# Patient Record
Sex: Female | Born: 1985 | Race: Black or African American | Hispanic: No | Marital: Single | Smoking: Never smoker
Health system: Southern US, Community
[De-identification: ages and names within clinical notes are randomized; demographics above are authoritative.]

## PROBLEM LIST (undated history)

## (undated) DIAGNOSIS — D696 Thrombocytopenia, unspecified: Secondary | ICD-10-CM

## (undated) DIAGNOSIS — J45909 Unspecified asthma, uncomplicated: Secondary | ICD-10-CM

## (undated) DIAGNOSIS — Z969 Presence of functional implant, unspecified: Secondary | ICD-10-CM

## (undated) DIAGNOSIS — E669 Obesity, unspecified: Secondary | ICD-10-CM

## (undated) DIAGNOSIS — D649 Anemia, unspecified: Secondary | ICD-10-CM

## (undated) HISTORY — PX: ORIF ELBOW FRACTURE: SHX5031

---

## 2008-02-16 ENCOUNTER — Other Ambulatory Visit: Payer: Self-pay | Admitting: Physician Assistant

## 2008-02-16 ENCOUNTER — Ambulatory Visit: Payer: Self-pay | Admitting: Obstetrics and Gynecology

## 2008-02-16 ENCOUNTER — Inpatient Hospital Stay (HOSPITAL_COMMUNITY): Admission: AD | Admit: 2008-02-16 | Discharge: 2008-02-17 | Payer: Self-pay | Admitting: Obstetrics and Gynecology

## 2008-02-17 ENCOUNTER — Encounter (INDEPENDENT_AMBULATORY_CARE_PROVIDER_SITE_OTHER): Payer: Self-pay | Admitting: Obstetrics and Gynecology

## 2010-08-05 ENCOUNTER — Inpatient Hospital Stay (HOSPITAL_COMMUNITY)
Admission: AD | Admit: 2010-08-05 | Discharge: 2010-08-07 | Payer: Self-pay | Source: Home / Self Care | Admitting: Family Medicine

## 2010-09-02 ENCOUNTER — Observation Stay (HOSPITAL_COMMUNITY)
Admission: AD | Admit: 2010-09-02 | Discharge: 2010-09-03 | Payer: Self-pay | Source: Home / Self Care | Attending: Obstetrics & Gynecology | Admitting: Obstetrics & Gynecology

## 2010-09-03 HISTORY — PX: CERVICAL CERCLAGE: SHX1329

## 2010-09-05 ENCOUNTER — Encounter: Payer: Self-pay | Admitting: Obstetrics & Gynecology

## 2010-09-05 ENCOUNTER — Ambulatory Visit: Payer: Self-pay | Admitting: Obstetrics and Gynecology

## 2010-09-05 LAB — CONVERTED CEMR LAB
HCT: 35.2 % — ABNORMAL LOW (ref 36.0–46.0)
Hepatitis B Surface Ag: NEGATIVE
Lymphocytes Relative: 21 % (ref 12–46)
Lymphs Abs: 1.7 10*3/uL (ref 0.7–4.0)
Neutrophils Relative %: 75 % (ref 43–77)
Platelets: 43 10*3/uL — ABNORMAL LOW (ref 150–400)
Protein C Activity: 152 % — ABNORMAL HIGH (ref 75–133)
Protein S Activity: 52 % — ABNORMAL LOW (ref 69–129)
Rh Type: POSITIVE
Rubella: 62.4 intl units/mL — ABNORMAL HIGH
WBC: 7.8 10*3/uL (ref 4.0–10.5)

## 2010-09-07 ENCOUNTER — Ambulatory Visit: Payer: Self-pay | Admitting: Internal Medicine

## 2010-09-13 ENCOUNTER — Ambulatory Visit: Payer: Self-pay | Admitting: Obstetrics and Gynecology

## 2010-09-13 ENCOUNTER — Encounter (INDEPENDENT_AMBULATORY_CARE_PROVIDER_SITE_OTHER): Payer: Self-pay | Admitting: *Deleted

## 2010-09-16 ENCOUNTER — Inpatient Hospital Stay (HOSPITAL_COMMUNITY)
Admission: AD | Admit: 2010-09-16 | Discharge: 2010-09-16 | Payer: Self-pay | Source: Home / Self Care | Attending: Family Medicine | Admitting: Family Medicine

## 2010-09-19 ENCOUNTER — Ambulatory Visit: Payer: Self-pay | Admitting: Obstetrics & Gynecology

## 2010-09-20 ENCOUNTER — Encounter: Payer: Self-pay | Admitting: Obstetrics & Gynecology

## 2010-09-20 LAB — CONVERTED CEMR LAB: Hgb A1c MFr Bld: 5.7 % — ABNORMAL HIGH (ref <5.7)

## 2010-09-26 ENCOUNTER — Inpatient Hospital Stay (HOSPITAL_COMMUNITY)
Admission: AD | Admit: 2010-09-26 | Discharge: 2010-09-26 | Payer: Self-pay | Source: Home / Self Care | Attending: Obstetrics & Gynecology | Admitting: Obstetrics & Gynecology

## 2010-09-26 ENCOUNTER — Ambulatory Visit
Admission: RE | Admit: 2010-09-26 | Discharge: 2010-09-26 | Payer: Self-pay | Source: Home / Self Care | Attending: Obstetrics & Gynecology | Admitting: Obstetrics & Gynecology

## 2010-09-26 ENCOUNTER — Encounter
Admission: RE | Admit: 2010-09-26 | Discharge: 2010-10-18 | Payer: Self-pay | Source: Home / Self Care | Attending: Obstetrics and Gynecology | Admitting: Obstetrics and Gynecology

## 2010-09-30 ENCOUNTER — Ambulatory Visit (HOSPITAL_COMMUNITY)
Admission: RE | Admit: 2010-09-30 | Discharge: 2010-09-30 | Payer: Self-pay | Source: Home / Self Care | Attending: Obstetrics & Gynecology | Admitting: Obstetrics & Gynecology

## 2010-09-30 ENCOUNTER — Inpatient Hospital Stay (HOSPITAL_COMMUNITY)
Admission: AD | Admit: 2010-09-30 | Discharge: 2010-10-04 | Payer: Self-pay | Source: Home / Self Care | Attending: Obstetrics & Gynecology | Admitting: Obstetrics & Gynecology

## 2010-10-03 ENCOUNTER — Ambulatory Visit: Admit: 2010-10-03 | Payer: Self-pay | Admitting: Obstetrics and Gynecology

## 2010-10-03 LAB — GLUCOSE, CAPILLARY
Glucose-Capillary: 147 mg/dL — ABNORMAL HIGH (ref 70–99)
Glucose-Capillary: 228 mg/dL — ABNORMAL HIGH (ref 70–99)
Glucose-Capillary: 112 mg/dL — ABNORMAL HIGH (ref 70–99)
Glucose-Capillary: 139 mg/dL — ABNORMAL HIGH (ref 70–99)
Glucose-Capillary: 146 mg/dL — ABNORMAL HIGH (ref 70–99)
Glucose-Capillary: 59 mg/dL — ABNORMAL LOW (ref 70–99)
Glucose-Capillary: 77 mg/dL (ref 70–99)

## 2010-10-03 LAB — STREP B DNA PROBE: Strep Group B Ag: NEGATIVE

## 2010-10-03 LAB — POCT URINALYSIS DIPSTICK
Bilirubin Urine: NEGATIVE
Hgb urine dipstick: NEGATIVE
Ketones, ur: 40 mg/dL — AB
Nitrite: NEGATIVE
Protein, ur: NEGATIVE mg/dL
Specific Gravity, Urine: >=1.03 (ref 1.005–1.030)
Urine Glucose, Fasting: NEGATIVE mg/dL
Urobilinogen, UA: 1 mg/dL (ref 0.0–1.0)
pH: 6 (ref 5.0–8.0)

## 2010-10-03 LAB — CBC
HCT: 32.2 % — ABNORMAL LOW (ref 36.0–46.0)
Hemoglobin: 11.2 g/dL — ABNORMAL LOW (ref 12.0–15.0)
MCH: 29.2 pg (ref 26.0–34.0)
MCHC: 34.8 g/dL (ref 30.0–36.0)
MCV: 83.9 fL (ref 78.0–100.0)
Platelets: DECREASED 10*3/uL (ref 150–400)
RBC: 3.84 MIL/uL — ABNORMAL LOW (ref 3.87–5.11)
RDW: 14.5 % (ref 11.5–15.5)
WBC: 13.8 10*3/uL — ABNORMAL HIGH (ref 4.0–10.5)

## 2010-10-05 LAB — GLUCOSE, CAPILLARY
Glucose-Capillary: 101 mg/dL — ABNORMAL HIGH (ref 70–99)
Glucose-Capillary: 85 mg/dL (ref 70–99)
Glucose-Capillary: 84 mg/dL (ref 70–99)
Glucose-Capillary: 134 mg/dL — ABNORMAL HIGH (ref 70–99)
Glucose-Capillary: 92 mg/dL (ref 70–99)
Glucose-Capillary: 89 mg/dL (ref 70–99)
Glucose-Capillary: 104 mg/dL — ABNORMAL HIGH (ref 70–99)
Glucose-Capillary: 135 mg/dL — ABNORMAL HIGH (ref 70–99)
Glucose-Capillary: 87 mg/dL (ref 70–99)

## 2010-10-07 ENCOUNTER — Ambulatory Visit: Payer: Self-pay | Admitting: Internal Medicine

## 2010-10-10 ENCOUNTER — Ambulatory Visit
Admission: RE | Admit: 2010-10-10 | Discharge: 2010-10-10 | Payer: Self-pay | Source: Home / Self Care | Attending: Obstetrics & Gynecology | Admitting: Obstetrics & Gynecology

## 2010-10-10 ENCOUNTER — Ambulatory Visit (HOSPITAL_COMMUNITY)
Admission: RE | Admit: 2010-10-10 | Discharge: 2010-10-10 | Payer: Self-pay | Source: Home / Self Care | Attending: Obstetrics & Gynecology | Admitting: Obstetrics & Gynecology

## 2010-10-10 ENCOUNTER — Encounter (INDEPENDENT_AMBULATORY_CARE_PROVIDER_SITE_OTHER): Payer: Self-pay | Admitting: *Deleted

## 2010-10-10 NOTE — Discharge Summary (Addendum)
  NAMEKEYRI, [name]                ACCOUNT NO.:  [id number]  MEDICAL RECORD NO.:  [id number]          PATIENT TYPE:  INP  LOCATION:  9156                          [facility]  PHYSICIAN:  Horton Chin, MD DATE OF BIRTH:  16-Apr-1986  DATE OF ADMISSION:  09/30/2010 DATE OF DISCHARGE:  10/04/2010                              DISCHARGE SUMMARY   ADMISSION DIAGNOSES: 1. Intrauterine pregnancy at 23-6/7th weeks. 2. History of cervical incompetence status post cerclage placement     earlier this pregnancy. 3. Current ultrasound showing no measurable cervical length.  DISCHARGE DIAGNOSES: 1. Intrauterine pregnancy at 24-3/7th. 2. Measurable cervix on ultrasound, but no cervical change or signs of     preterm labor. 3. Thrombocytopenia.  PERTINENT STUDIES:  On admission, the patient had an ultrasound with unmeasurable cervical length which was unchanged during her admission. She had no signs or symptoms of labor.  A group B strep culture that was done on October 01, 2010, was negative.  CBC showed a white blood cell count of 13.8, hemoglobin of 11.2, platelet count was not calculated because it appeared decreased, the patient does have a history of thrombocytopenia.  CONSULTS:  Neonatology.  BRIEF HOSPITAL COURSE:  The patient is a 25 year old gravida 4, para 0-1- 2-0, who was admitted at 23-6/7th weeks after cervical length ultrasound showed no demonstrable cervical length.  The patient had a cerclage that was placed which was a rescue cerclage on September 04, 2010, at 20 weeks. Given this new finding of no demonstrable cervical length, the patient was admitted for observation.  She reported rare contractions but did not demonstrate any cervical change.  Fetal heart rate tracing remained reassuring at all times and the patient was also seen by the Neonatology Team for consultation.  She had no vaginal bleeding, leakage of fluid, or frequent contractions, and endorsed good  fetal movement.  On October 04, 2010, the patient was deemed stable on bedrest and no other interventions were needed.  She was deemed stable for discharge home.  DISCHARGE INSTRUCTIONS:  Included being on strict bedrest and pelvic rest.  Preterm labor and fetal movement precautions were reviewed.  The patient was given the routine antenatal discharge instruction sheet.  DISCHARGE MEDICATIONS:  The patient was told to continue on  Prometrium 200 mg per vagina every night, and glyburide 5 mg twice a day.  Of note, her sugars were monitored during her stay and her glyburide was slightly increased to cover her blood sugars, especially after she received betamethasone dose on admission.  She was given a prescription of glyburide 5 mg twice a day and Ambien 10 mg as needed for insomnia.  FOLLOWUP APPOINTMENTS:  Followup appointment has been scheduled in the [facility] on Monday, October 10, 2010 at 9:15 a.m.  This information was communicated to the patient and her mother.     Horton Chin, MD     UAA/MEDQ  D:  10/04/2010  T:  10/05/2010  Job:  [id number]  Electronically Signed by Jaynie Collins MD on 10/10/2010 09:58:08 AM

## 2010-10-11 LAB — CBC WITH DIFFERENTIAL/PLATELET
BASO%: 0.3 % (ref 0.0–2.0)
Basophils Absolute: 0 10*3/uL (ref 0.0–0.1)
EOS%: 0.5 % (ref 0.0–7.0)
Eosinophils Absolute: 0 10*3/uL (ref 0.0–0.5)
HCT: 34.4 % — ABNORMAL LOW (ref 34.8–46.6)
HGB: 11.6 g/dL (ref 11.6–15.9)
LYMPH%: 21.7 % (ref 14.0–49.7)
MCH: 30.2 pg (ref 25.1–34.0)
MCHC: 33.7 g/dL (ref 31.5–36.0)
MCV: 89.8 fL (ref 79.5–101.0)
MONO#: 0.5 10*3/uL (ref 0.1–0.9)
MONO%: 5.1 % (ref 0.0–14.0)
NEUT#: 7 10*3/uL — ABNORMAL HIGH (ref 1.5–6.5)
NEUT%: 72.4 % (ref 38.4–76.8)
Platelets: 46 10*3/uL — ABNORMAL LOW (ref 145–400)
RBC: 3.83 10*6/uL (ref 3.70–5.45)
RDW: 15.1 % — ABNORMAL HIGH (ref 11.2–14.5)
WBC: 9.7 10*3/uL (ref 3.9–10.3)
lymph#: 2.1 10*3/uL (ref 0.9–3.3)

## 2010-10-11 LAB — POCT URINALYSIS DIPSTICK
Bilirubin Urine: NEGATIVE
Hgb urine dipstick: NEGATIVE
Ketones, ur: NEGATIVE mg/dL
Nitrite: NEGATIVE
Protein, ur: NEGATIVE mg/dL
Specific Gravity, Urine: >=1.03 (ref 1.005–1.030)
Urine Glucose, Fasting: NEGATIVE mg/dL
Urobilinogen, UA: 0.2 mg/dL (ref 0.0–1.0)
pH: 7 (ref 5.0–8.0)

## 2010-10-11 LAB — LACTATE DEHYDROGENASE: LDH: 113 U/L (ref 94–250)

## 2010-10-17 ENCOUNTER — Inpatient Hospital Stay (HOSPITAL_COMMUNITY)
Admission: AD | Admit: 2010-10-17 | Discharge: 2010-10-17 | Payer: Self-pay | Source: Home / Self Care | Attending: Obstetrics & Gynecology | Admitting: Obstetrics & Gynecology

## 2010-10-17 ENCOUNTER — Ambulatory Visit: Admit: 2010-10-17 | Payer: Self-pay | Admitting: Obstetrics and Gynecology

## 2010-10-17 LAB — URINALYSIS, ROUTINE W REFLEX MICROSCOPIC
Bilirubin Urine: NEGATIVE
Ketones, ur: NEGATIVE mg/dL
Protein, ur: NEGATIVE mg/dL
Specific Gravity, Urine: >1.03 — ABNORMAL HIGH (ref 1.005–1.030)
Urobilinogen, UA: 0.2 mg/dL (ref 0.0–1.0)

## 2010-10-17 LAB — RAPID URINE DRUG SCREEN, HOSP PERFORMED
Amphetamines: NOT DETECTED
Barbiturates: NOT DETECTED
Opiates: NOT DETECTED

## 2010-10-17 LAB — URINE MICROSCOPIC-ADD ON

## 2010-10-18 LAB — URINE CULTURE: Culture  Setup Time: 201201301142

## 2010-10-24 ENCOUNTER — Other Ambulatory Visit: Payer: Self-pay | Admitting: Family Medicine

## 2010-10-24 ENCOUNTER — Other Ambulatory Visit: Payer: Self-pay

## 2010-10-24 DIAGNOSIS — Z3689 Encounter for other specified antenatal screening: Secondary | ICD-10-CM

## 2010-10-24 DIAGNOSIS — O3430 Maternal care for cervical incompetence, unspecified trimester: Secondary | ICD-10-CM

## 2010-10-24 DIAGNOSIS — O99810 Abnormal glucose complicating pregnancy: Secondary | ICD-10-CM

## 2010-10-24 LAB — POCT URINALYSIS DIPSTICK
Nitrite: NEGATIVE
Urine Glucose, Fasting: NEGATIVE mg/dL
Urobilinogen, UA: 0.2 mg/dL (ref 0.0–1.0)

## 2010-10-25 ENCOUNTER — Ambulatory Visit (HOSPITAL_COMMUNITY)
Admission: RE | Admit: 2010-10-25 | Discharge: 2010-10-25 | Disposition: A | Discharge status: Home / Self Care | Payer: Medicaid Other | Source: Ambulatory Visit | Attending: Family Medicine | Admitting: Family Medicine

## 2010-10-25 DIAGNOSIS — Z3689 Encounter for other specified antenatal screening: Secondary | ICD-10-CM

## 2010-10-28 ENCOUNTER — Ambulatory Visit (HOSPITAL_COMMUNITY): Payer: Medicaid Other

## 2010-10-31 ENCOUNTER — Encounter: Payer: Self-pay | Admitting: Obstetrics & Gynecology

## 2010-10-31 ENCOUNTER — Other Ambulatory Visit: Payer: Self-pay

## 2010-10-31 DIAGNOSIS — O99810 Abnormal glucose complicating pregnancy: Secondary | ICD-10-CM

## 2010-10-31 DIAGNOSIS — IMO0002 Reserved for concepts with insufficient information to code with codable children: Secondary | ICD-10-CM

## 2010-10-31 DIAGNOSIS — O26879 Cervical shortening, unspecified trimester: Secondary | ICD-10-CM

## 2010-10-31 DIAGNOSIS — O09299 Supervision of pregnancy with other poor reproductive or obstetric history, unspecified trimester: Secondary | ICD-10-CM

## 2010-10-31 DIAGNOSIS — O3430 Maternal care for cervical incompetence, unspecified trimester: Secondary | ICD-10-CM

## 2010-10-31 LAB — CONVERTED CEMR LAB
Hgb A1c MFr Bld: 5.3 % (ref <5.7)
MCV: 85.9 fL (ref 78.0–100.0)
Platelets: 57 10*3/uL — ABNORMAL LOW (ref 150–400)
RDW: 14.1 % (ref 11.5–15.5)
WBC: 8.1 10*3/uL (ref 4.0–10.5)

## 2010-10-31 LAB — POCT URINALYSIS DIPSTICK
Bilirubin Urine: NEGATIVE
Nitrite: NEGATIVE
Urine Glucose, Fasting: NEGATIVE mg/dL
pH: 5.5 (ref 5.0–8.0)

## 2010-10-31 LAB — GLUCOSE, CAPILLARY

## 2010-11-07 DIAGNOSIS — O26879 Cervical shortening, unspecified trimester: Secondary | ICD-10-CM

## 2010-11-07 DIAGNOSIS — IMO0002 Reserved for concepts with insufficient information to code with codable children: Secondary | ICD-10-CM

## 2010-11-07 DIAGNOSIS — O3430 Maternal care for cervical incompetence, unspecified trimester: Secondary | ICD-10-CM

## 2010-11-07 DIAGNOSIS — O99810 Abnormal glucose complicating pregnancy: Secondary | ICD-10-CM

## 2010-11-10 ENCOUNTER — Other Ambulatory Visit: Payer: Self-pay | Admitting: Internal Medicine

## 2010-11-10 ENCOUNTER — Encounter (HOSPITAL_BASED_OUTPATIENT_CLINIC_OR_DEPARTMENT_OTHER): Payer: Medicaid Other | Admitting: Internal Medicine

## 2010-11-10 DIAGNOSIS — O99119 Other diseases of the blood and blood-forming organs and certain disorders involving the immune mechanism complicating pregnancy, unspecified trimester: Secondary | ICD-10-CM

## 2010-11-10 DIAGNOSIS — D696 Thrombocytopenia, unspecified: Secondary | ICD-10-CM

## 2010-11-10 DIAGNOSIS — D6942 Congenital and hereditary thrombocytopenia purpura: Secondary | ICD-10-CM

## 2010-11-10 LAB — CBC WITH DIFFERENTIAL/PLATELET
BASO%: 0.3 % (ref 0.0–2.0)
EOS%: 0.6 % (ref 0.0–7.0)
LYMPH%: 25.9 % (ref 14.0–49.7)
MCH: 30.4 pg (ref 25.1–34.0)
MCHC: 34.7 g/dL (ref 31.5–36.0)
MONO#: 0.5 10*3/uL (ref 0.1–0.9)
MONO%: 6.6 % (ref 0.0–14.0)
Platelets: 49 10*3/uL — ABNORMAL LOW (ref 145–400)
RBC: 3.79 10*6/uL (ref 3.70–5.45)
WBC: 7.1 10*3/uL (ref 3.9–10.3)

## 2010-11-10 LAB — LACTATE DEHYDROGENASE: LDH: 142 U/L (ref 94–250)

## 2010-11-14 ENCOUNTER — Other Ambulatory Visit: Payer: Self-pay | Admitting: Family Medicine

## 2010-11-14 DIAGNOSIS — IMO0002 Reserved for concepts with insufficient information to code with codable children: Secondary | ICD-10-CM

## 2010-11-14 DIAGNOSIS — O3430 Maternal care for cervical incompetence, unspecified trimester: Secondary | ICD-10-CM

## 2010-11-14 DIAGNOSIS — O09299 Supervision of pregnancy with other poor reproductive or obstetric history, unspecified trimester: Secondary | ICD-10-CM

## 2010-11-14 DIAGNOSIS — O99810 Abnormal glucose complicating pregnancy: Secondary | ICD-10-CM

## 2010-11-28 ENCOUNTER — Ambulatory Visit (HOSPITAL_COMMUNITY)
Admission: RE | Admit: 2010-11-28 | Discharge: 2010-11-28 | Disposition: A | Discharge status: Home / Self Care | Payer: Medicaid Other | Source: Ambulatory Visit | Attending: Family Medicine | Admitting: Family Medicine

## 2010-11-28 ENCOUNTER — Other Ambulatory Visit: Payer: Self-pay

## 2010-11-28 DIAGNOSIS — O09299 Supervision of pregnancy with other poor reproductive or obstetric history, unspecified trimester: Secondary | ICD-10-CM | POA: Insufficient documentation

## 2010-11-28 DIAGNOSIS — O3430 Maternal care for cervical incompetence, unspecified trimester: Secondary | ICD-10-CM

## 2010-11-28 DIAGNOSIS — O99810 Abnormal glucose complicating pregnancy: Secondary | ICD-10-CM

## 2010-11-28 LAB — WET PREP, GENITAL
Trich, Wet Prep: NONE SEEN
Yeast Wet Prep HPF POC: NONE SEEN

## 2010-11-28 LAB — CBC
HCT: 29.9 % — ABNORMAL LOW (ref 36.0–46.0)
Hemoglobin: 10.7 g/dL — ABNORMAL LOW (ref 12.0–15.0)
MCH: 27.9 pg (ref 26.0–34.0)
MCHC: 34.9 g/dL (ref 30.0–36.0)
MCHC: 35.8 g/dL (ref 30.0–36.0)
MCV: 78.5 fL (ref 78.0–100.0)
RDW: 15 % (ref 11.5–15.5)
RDW: 15.1 % (ref 11.5–15.5)
WBC: 8 10*3/uL (ref 4.0–10.5)

## 2010-11-28 LAB — POCT URINALYSIS DIPSTICK
Bilirubin Urine: NEGATIVE
Ketones, ur: NEGATIVE mg/dL
Ketones, ur: NEGATIVE mg/dL
Nitrite: NEGATIVE
Protein, ur: NEGATIVE mg/dL
Protein, ur: NEGATIVE mg/dL
Protein, ur: NEGATIVE mg/dL
Specific Gravity, Urine: 1.025 (ref 1.005–1.030)
Urobilinogen, UA: 0.2 mg/dL (ref 0.0–1.0)
Urobilinogen, UA: 1 mg/dL (ref 0.0–1.0)
pH: 6 (ref 5.0–8.0)
pH: 6 (ref 5.0–8.0)
pH: 6.5 (ref 5.0–8.0)

## 2010-11-28 LAB — URINALYSIS, ROUTINE W REFLEX MICROSCOPIC
Bilirubin Urine: NEGATIVE
Ketones, ur: NEGATIVE mg/dL
Nitrite: NEGATIVE
Protein, ur: NEGATIVE mg/dL

## 2010-11-28 LAB — ABO/RH: ABO/RH(D): O POS

## 2010-11-29 LAB — COMPREHENSIVE METABOLIC PANEL
ALT: 11 U/L (ref 0–35)
Albumin: 3.1 g/dL — ABNORMAL LOW (ref 3.5–5.2)
Alkaline Phosphatase: 36 U/L — ABNORMAL LOW (ref 39–117)
BUN: 3 mg/dL — ABNORMAL LOW (ref 6–23)
Potassium: 3.8 mEq/L (ref 3.5–5.1)
Sodium: 134 mEq/L — ABNORMAL LOW (ref 135–145)
Total Protein: 6.1 g/dL (ref 6.0–8.3)

## 2010-11-29 LAB — URINE MICROSCOPIC-ADD ON

## 2010-11-29 LAB — URINALYSIS, ROUTINE W REFLEX MICROSCOPIC
Bilirubin Urine: NEGATIVE
Glucose, UA: NEGATIVE mg/dL
Hgb urine dipstick: NEGATIVE
Ketones, ur: NEGATIVE mg/dL
Protein, ur: 30 mg/dL — AB
pH: 6 (ref 5.0–8.0)

## 2010-11-29 LAB — RAPID URINE DRUG SCREEN, HOSP PERFORMED
Amphetamines: NOT DETECTED
Benzodiazepines: NOT DETECTED
Cocaine: NOT DETECTED

## 2010-11-29 LAB — AMYLASE: Amylase: 76 U/L (ref 0–105)

## 2010-11-29 LAB — WET PREP, GENITAL
Trich, Wet Prep: NONE SEEN
Yeast Wet Prep HPF POC: NONE SEEN
Yeast Wet Prep HPF POC: NONE SEEN

## 2010-11-29 LAB — GC/CHLAMYDIA PROBE AMP, GENITAL: Chlamydia, DNA Probe: NEGATIVE

## 2010-11-29 LAB — URINALYSIS, MICROSCOPIC ONLY
Leukocytes, UA: NEGATIVE
Nitrite: NEGATIVE
Specific Gravity, Urine: 1.03 (ref 1.005–1.030)
pH: 6 (ref 5.0–8.0)

## 2010-11-29 LAB — URINE CULTURE: Colony Count: 25000

## 2010-11-29 LAB — CBC
Platelets: 38 10*3/uL — ABNORMAL LOW (ref 150–400)
RDW: 21.3 % — ABNORMAL HIGH (ref 11.5–15.5)
WBC: 8.4 10*3/uL (ref 4.0–10.5)

## 2010-12-02 ENCOUNTER — Other Ambulatory Visit: Payer: Medicaid Other

## 2010-12-02 DIAGNOSIS — O99810 Abnormal glucose complicating pregnancy: Secondary | ICD-10-CM

## 2010-12-05 ENCOUNTER — Other Ambulatory Visit: Payer: Medicaid Other

## 2010-12-05 DIAGNOSIS — O24919 Unspecified diabetes mellitus in pregnancy, unspecified trimester: Secondary | ICD-10-CM

## 2010-12-05 DIAGNOSIS — D696 Thrombocytopenia, unspecified: Secondary | ICD-10-CM

## 2010-12-06 ENCOUNTER — Inpatient Hospital Stay (HOSPITAL_COMMUNITY)
Admission: AD | Admit: 2010-12-06 | Discharge: 2010-12-06 | Disposition: A | Discharge status: Home / Self Care | Payer: Medicaid Other | Source: Ambulatory Visit | Attending: Obstetrics and Gynecology | Admitting: Obstetrics and Gynecology

## 2010-12-06 DIAGNOSIS — O99891 Other specified diseases and conditions complicating pregnancy: Secondary | ICD-10-CM | POA: Insufficient documentation

## 2010-12-06 DIAGNOSIS — O9989 Other specified diseases and conditions complicating pregnancy, childbirth and the puerperium: Secondary | ICD-10-CM

## 2010-12-06 DIAGNOSIS — H109 Unspecified conjunctivitis: Secondary | ICD-10-CM

## 2010-12-07 ENCOUNTER — Other Ambulatory Visit: Payer: Medicaid Other

## 2010-12-07 DIAGNOSIS — O99810 Abnormal glucose complicating pregnancy: Secondary | ICD-10-CM

## 2010-12-09 ENCOUNTER — Other Ambulatory Visit: Payer: Medicaid Other

## 2010-12-14 ENCOUNTER — Inpatient Hospital Stay (HOSPITAL_COMMUNITY)
Admission: AD | Admit: 2010-12-14 | Discharge: 2010-12-17 | DRG: 775 | Disposition: A | Discharge status: Home Health | Payer: Medicaid Other | Source: Ambulatory Visit | Attending: Obstetrics and Gynecology | Admitting: Obstetrics and Gynecology

## 2010-12-14 ENCOUNTER — Other Ambulatory Visit: Payer: Medicaid Other

## 2010-12-14 ENCOUNTER — Other Ambulatory Visit: Payer: Self-pay | Admitting: Obstetrics and Gynecology

## 2010-12-14 DIAGNOSIS — D689 Coagulation defect, unspecified: Secondary | ICD-10-CM | POA: Diagnosis present

## 2010-12-14 DIAGNOSIS — O99814 Abnormal glucose complicating childbirth: Principal | ICD-10-CM | POA: Diagnosis present

## 2010-12-14 DIAGNOSIS — D696 Thrombocytopenia, unspecified: Secondary | ICD-10-CM | POA: Diagnosis present

## 2010-12-14 DIAGNOSIS — O3430 Maternal care for cervical incompetence, unspecified trimester: Secondary | ICD-10-CM | POA: Diagnosis present

## 2010-12-14 DIAGNOSIS — O99810 Abnormal glucose complicating pregnancy: Secondary | ICD-10-CM

## 2010-12-14 DIAGNOSIS — Z8673 Personal history of transient ischemic attack (TIA), and cerebral infarction without residual deficits: Secondary | ICD-10-CM

## 2010-12-14 DIAGNOSIS — Z331 Pregnant state, incidental: Secondary | ICD-10-CM

## 2010-12-14 DIAGNOSIS — O4290 Premature rupture of membranes, unspecified as to length of time between rupture and onset of labor, unspecified weeks of gestation: Secondary | ICD-10-CM | POA: Diagnosis present

## 2010-12-14 LAB — CBC
HCT: 35.3 % — ABNORMAL LOW (ref 36.0–46.0)
Hemoglobin: 12.1 g/dL (ref 12.0–15.0)
MCH: 28.7 pg (ref 26.0–34.0)
MCV: 83.8 fL (ref 78.0–100.0)
Platelets: 59 10*3/uL — ABNORMAL LOW (ref 150–400)
RBC: 4.21 MIL/uL (ref 3.87–5.11)

## 2010-12-14 LAB — POCT URINALYSIS DIP (DEVICE)
Glucose, UA: NEGATIVE mg/dL
Specific Gravity, Urine: 1.015 (ref 1.005–1.030)
Urobilinogen, UA: 1 mg/dL (ref 0.0–1.0)

## 2010-12-15 ENCOUNTER — Other Ambulatory Visit: Payer: Self-pay | Admitting: Family Medicine

## 2010-12-15 DIAGNOSIS — O9912 Other diseases of the blood and blood-forming organs and certain disorders involving the immune mechanism complicating childbirth: Secondary | ICD-10-CM

## 2010-12-15 DIAGNOSIS — O99814 Abnormal glucose complicating childbirth: Secondary | ICD-10-CM

## 2010-12-15 DIAGNOSIS — D689 Coagulation defect, unspecified: Secondary | ICD-10-CM

## 2010-12-15 DIAGNOSIS — O3430 Maternal care for cervical incompetence, unspecified trimester: Secondary | ICD-10-CM

## 2010-12-15 LAB — GLUCOSE, CAPILLARY
Glucose-Capillary: 84 mg/dL (ref 70–99)
Glucose-Capillary: 88 mg/dL (ref 70–99)
Glucose-Capillary: 96 mg/dL (ref 70–99)

## 2010-12-20 NOTE — Discharge Summary (Addendum)
NAMESUSSIE, MINOR                ACCOUNT NO.:  [id number]  MEDICAL RECORD NO.:  [id number]           PATIENT TYPE:  I  LOCATION:  9309                          [facility]  PHYSICIAN:  Catalina Antigua, MD     DATE OF BIRTH:  May 15, 1986  DATE OF ADMISSION:  12/14/2010 DATE OF DISCHARGE:  12/17/2010                              DISCHARGE SUMMARY   The patient was admitted in labor and was admitted for labor and delivery.  She had a history of a stroke last placement and gestational diabetes which was also managed during her hospital stay.  ADMISSION DIAGNOSES:  A 25 year old gravida 4, para 0-1-1-0 who was at 96 weeks and 4 days' estimated gestational age with a history of a cerclage that was placed early in pregnancy, gestational diabetes, and premature rupture of membranes.  DISCHARGE DIAGNOSES:  A 25 year old gravida 4, para 0-2-2-1, status post SVD, history of gestational diabetes and premature rupture of membranes.  HISTORY OF PRESENT ILLNESS:  The patient is a 25 year old gravida 4, now para 0-2-2-1 who presented at 34 weeks and 4 days in the [facility] who was noted to have prolonged rupture of membranes.  She states that she started leaking fluid approximately on December 06, 2010. She was out of town and presented to the clinic on December 14, 2010, and was examined and was determined to have been ruptured and was admitted for labor.  She has a history of cerclage that was placed and gestational diabetes that she was taking glyburide for.  The patient was admitted and induction of labor was started after the cerclage was removed.  One cerclage was removed with informed consent by Dr. Catalina Antigua.  She was monitored for labor and also noted in general thrombocytopenia during her labor process.  She was given penicillin for GBS prophylaxis and Pitocin for her induction.  No epidural was used due to her thrombocytopenia.  The patient delivered spontaneously  without difficulty on December 15, 2010.  She delivered a viable infant female who remains in NICU at the time of this dictation.  Upon discharge, the patient is noted to be coming her breast milk and is unsure what she wants to do her birth control at this point.  She had previously stated she was interested in doing a bilateral tubal ligation, but her decision was continued to wax and wane.  She is given discharge instructions to present to [facility] within 4 weeks to discuss her birth control options, especially if she is decided she would like a bilateral tubal ligation, so she could be placed on the OR Board.  She declines Depo- Provera at the time of discharge and gives no real commitment to any form of birth control upon discharge.  The patient is instructed to return to the MAU for acute care if she develops abdominal tenderness associated with fever and increased bleeding or any other concerns in the postpartum.  We will address this care for her as necessary.  She voices understanding and agrees with these plans.  The patient is discharged home.    ______________________________ Selena Lesser  Natale Milch, DO   ______________________________ Catalina Antigua, MD    SH/MEDQ  D:  12/17/2010  T:  12/17/2010  Job:  [id number]  Electronically Signed by Lucina Mellow MD on 12/20/2010 09:19:30 AM Electronically Signed by Catalina Antigua  on 12/27/2010 10:39:16 AM

## 2011-01-11 ENCOUNTER — Ambulatory Visit (INDEPENDENT_AMBULATORY_CARE_PROVIDER_SITE_OTHER): Payer: Medicaid Other | Admitting: Obstetrics and Gynecology

## 2011-01-11 NOTE — H&P (Signed)
[name] [name], [name] [name]                ACCOUNT NO.:  [id number]  MEDICAL RECORD NO.:  [id number]           PATIENT TYPE:  A  [facility]ATION:  [facility]                   [facility]  PHYSICIAN:  Catalina Antigua, MD     DATE OF BIRTH:  [birth year]  DATE OF SERVICE:                          PRE-OP HISTORY & PHYSICAL  HISTORY OF PRESENT ILLNESS:  This is a 25 year old G4 P0-2-2-1 who is status post vaginal delivery on December 15, 2010 at 34 weeks and 5 days status post TPROM presenting today for postoperative check.  The patient is doing well.  Denies abnormal bleeding or discharge, is currently breast and bottle feeding.  The baby is doing well and is at home with the mother.  The patient denies any signs or symptoms of postpartum depression.  The patient reports receiving ample help with the baby. The patient is also interested in bilateral tubal ligation.  She had her consent for sterilization paper signed on October 24, 2010.  The patient is without any other complaints.  PAST MEDICAL HISTORY:  She denies.  PAST SURGICAL HISTORY:  Also denies.  PAST OB HISTORY:  She has had history of a 25 week IUFD.  She has had a termination of spontaneous miscarriage and this pregnancy was complicated by incompetent cervix requiring a cervical cerclage and TPROM at 34 weeks.  GYN HISTORY:  She denies history of cyst, fibroids, or abnormal Pap smear.  FAMILY HISTORY:  Noncontributory.  SOCIAL HISTORY:  She denies drinking, smoking, and use of illicit drugs.  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  No drugs or latex allergy.  MEDICATIONS:  She is currently not taking any medication other than prenatal vitamins.  REVIEW OF SYSTEMS:  Otherwise within normal limits.  PHYSICAL EXAMINATION:  VITAL SIGNS:  Her blood pressure is 129/85, pulse of 81, weight of 85.9 kg, height of 64 inches. LUNGS:  Clear to auscultation bilaterally. HEART:  Regular rate and  rhythm. ABDOMEN:  Soft, nontender, nondistended. BREASTS:  Nontender, nondistended.  No engorgement.  No palpable masses or lymphadenopathy. PELVIC:  Normal vaginal mucosa and normal appearing cervix.  No abnormal bleeding or discharge.  She has small anteverted uterus.  No palpable adnexal masses or tenderness.  ASSESSMENT/PLAN:  A 25 year old G73, P0-2-2-1 who is status post vaginal delivery at 34 weeks and 5 days on March 29 secondary to preterm premature rupture of membranes at 57 and 5 weeks, presenting today for postoperative check and surgical scheduling of bilateral tubal ligation. The patient is medically cleared to resume all activities of daily living.  The patient will be scheduled on Jan 18, 2011 at 1:00 p.m. for bilateral tubal ligation performed laparoscopically.  Risks, benefits, and alternatives were explained.  The patient verbalized understanding. All questions were answered.  The patient was consented for irreversible procedure.  The patient was counseled on other forms of birth control, other long term forms of birth control, and is not interested and truly desires a tubal ligation.          ______________________________ Catalina Antigua, MD    PC/MEDQ  D:  01/11/2011  T:  01/11/2011  Job:  [id number]

## 2011-01-13 ENCOUNTER — Encounter (HOSPITAL_COMMUNITY)
Admission: RE | Admit: 2011-01-13 | Discharge: 2011-01-13 | Disposition: A | Discharge status: Home / Self Care | Payer: Medicaid Other | Source: Ambulatory Visit | Attending: Obstetrics and Gynecology | Admitting: Obstetrics and Gynecology

## 2011-01-13 LAB — CBC
Platelets: 68 10*3/uL — ABNORMAL LOW (ref 150–400)
RBC: 4.17 MIL/uL (ref 3.87–5.11)
RDW: 13.5 % (ref 11.5–15.5)
WBC: 6.8 10*3/uL (ref 4.0–10.5)

## 2011-01-13 LAB — SURGICAL PCR SCREEN: Staphylococcus aureus: POSITIVE — AB

## 2011-01-18 ENCOUNTER — Ambulatory Visit (HOSPITAL_COMMUNITY)
Admission: RE | Admit: 2011-01-18 | Discharge: 2011-01-18 | Disposition: A | Discharge status: Home / Self Care | Payer: Medicaid Other | Source: Ambulatory Visit | Attending: Obstetrics and Gynecology | Admitting: Obstetrics and Gynecology

## 2011-01-18 DIAGNOSIS — Z01812 Encounter for preprocedural laboratory examination: Secondary | ICD-10-CM

## 2011-01-18 DIAGNOSIS — Z01818 Encounter for other preprocedural examination: Secondary | ICD-10-CM

## 2011-01-18 DIAGNOSIS — Z302 Encounter for sterilization: Secondary | ICD-10-CM | POA: Insufficient documentation

## 2011-01-18 HISTORY — PX: TUBAL LIGATION: SHX77

## 2011-01-18 LAB — PREGNANCY, URINE: Preg Test, Ur: NEGATIVE

## 2011-01-31 NOTE — Discharge Summary (Signed)
NAMEELHAM, FINI                ACCOUNT NO.:  [id number]   MEDICAL RECORD NO.:  [id number]          PATIENT TYPE:  INP   LOCATION:  9372                          [facility]   PHYSICIAN:  Ginger Carne, MD  DATE OF BIRTH:  [birth year]   DATE OF ADMISSION:  02/16/2008  DATE OF DISCHARGE:  02/17/2008                               DISCHARGE SUMMARY   REASON FOR HOSPITALIZATION:  A 17-week incomplete abortion.   IN-HOSPITAL PROCEDURES:  Spontaneous delivery of second trimester under  20-week fetus and spontaneous passage of placenta.   FINAL DIAGNOSIS:  A 17-week incomplete abortion.   HOSPITAL COURSE:  This patient is a 25 year old African American female  gravida 2, para 0-0-1-0, who presented at 29 weeks' gestation with  ruptured membranes and fetal parts in the vagina.  According to the  patient's history, she noted upon wiping herself on Feb 16, 2008, what  felt to be a fetal part.  This was not preceded by bleeding, cramping,  or spontaneous gush of fluid.  The patient presented with apparent  painless dilation of the cervix by history.  Her platelet count was  abnormal at 46,000 and it has remained steady at 43,000 on repeat  testing.  Apparently, the patient states she has family history of same.  The patient passed both fetus as well as placenta on February 17, 2008.  She  has had minimal flow since then.  She is Rh positive.  Vagina and cervix  intact post delivery, and the patient denies significant abdominal pain.   The patient was presented with options for birth control measures.  She  states that oral contraceptives make her nauseated and did not like the  feeling on the patch as well, and did not want Depo-Provera because she  does not like injections.  We discussed an IUD as a possible  alternative.  The patient will present to the [facility]  Department in 4-6 weeks for followup and contraceptive counseling.  The  patient was also informed that she  may well have an incompetent cervix,  and when she conceives again, (the patient advised to wait 6 months),  that she should obtain early prenatal care and mention to the [facility]  that a discussion was held regarding place on of a strap around the  cervix.  She had had a previous voluntary termination of pregnancy.   The patient advised to contact the office for significantly heavy  bleeding, increasing abdominal pain, temperature elevation above 100.4  degrees Fahrenheit, or foul-smelling discharge.  She was also asked to  take ibuprofen 600 mg (3 over-the-counter tablets) up to 4 times a day  as needed.  All questions answered in satisfaction to the patient.  The  patient verbalized understanding of the same.      Ginger Carne, MD  Electronically Signed     SHB/MEDQ  D:  02/17/2008  T:  02/18/2008  Job:  [id number]

## 2011-01-31 NOTE — H&P (Signed)
[name], [name]                ACCOUNT NO.:  [id number]   MEDICAL RECORD NO.:  [id number]          PATIENT TYPE:  INP   LOCATION:  9372                          [facility]   PHYSICIAN:  Phil D. Okey Dupre, M.D.     DATE OF BIRTH:  1986-07-30   DATE OF ADMISSION:  02/16/2008  DATE OF DISCHARGE:                              HISTORY & PHYSICAL   CHIEF COMPLAINT:  Severe abdominal cramping and bleeding.   HISTORY OF PRESENT ILLNESS:  The patient is a 25 year old African  American female, gravida 2, para 0-0-1-0 with a history of volunteered  interruption of pregnancy who in [facility] was found to  be pregnant, and had an ultrasound which was 17-week size fetus.  On  examination, the foot was presenting into the vaginal vault.  She was  sent over here to [facility] as an inevitable abortion.  On  examination in the emergency room, she was re-examined and tried to put  in the speculum, the small fetal foot was noticed.  The patient had been  given Rocephin IV at [facility] prior to her transfer.  When she arrived  here, her CBC note revealed a platelet count of 42,000.  The patient had  previously told that she had clots with heavy periods, but no history of  bruising; however, she had been told before that she had a low platelet  count at 22,000 and a family history where her mother, father, and  brother all have low platelets, however, she had never been fully worked  up for bleeding dyscrasia or ITP.   ALLERGIES:  None.   MEDICATIONS:  At present time, she had been taking none.   FAMILY HISTORY:  Of significance low platelet counts.   REVIEW OF SYSTEMS:  Negative with the exception of the present illness.   PHYSICAL EXAMINATION:  VITAL SIGNS:  Blood pressure is 120/80, pulse is  68 per minute, respirations were 14, and temperature 98.  GENERAL:  A well-developed, well-nourished [place] American female, in  no acute distress.  HEENT:  Within normal limits.  NECK:  Supple with no masses.  Thyroid is symmetrical.  BACK:  Erect.  LUNGS:  Clear to auscultation and percussion.  HEART:  No murmur.  Normal sinus rhythm.  BREASTS:  Symmetrical with no dominant masses.  ABDOMEN:  Soft, flat, and nontender.  PELVIS:  The uterus could be felt about half way up to the umbilicus.  No other organomegaly.  No CVA tenderness.  As mentioned above, there  was a fetal foot into the vaginal vault, and the nurse practitioner did  the pelvic and said she thought she was about 3 cm dilated.  EXTREMITIES:  Negative.  No edema.  No varices.  NEUROLOGIC:  DTRs within normal limits.   IMPRESSION:  1. Inevitable abortion.  2. Thrombocytopenia.  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   PLAN:  The patient has been given Cytotec per rectum in the MAU prior to  arriving on the floor 400 mcg and Pitocin has been put into the IV  bottle.  Phil D. Okey Dupre, M.D.  Electronically Signed     PDR/MEDQ  D:  02/16/2008  T:  02/17/2008  Job:  [id number]

## 2011-01-31 NOTE — Op Note (Signed)
  NAMECHEE, DIMON                ACCOUNT NO.:  [id number]  MEDICAL RECORD NO.:  [id number]           PATIENT TYPE:  O  LOCATION:  [facility]                          [facility]  PHYSICIAN:  Catalina Antigua, MD     DATE OF BIRTH:  06-30-1986  DATE OF PROCEDURE:  01/18/2011 DATE OF DISCHARGE:                              OPERATIVE REPORT   PREOPERATIVE DIAGNOSIS:  A 25 year old G4, P 0-2-2-1 with undesired fertility.  POSTOPERATIVE DIAGNOSIS:  A 25 year old G64, P 0-2-2-1 with undesired fertility.  PROCEDURE:  Laparoscopic bilateral tubal ligation.  SURGEON:  Catalina Antigua, MD  ASSISTANT:  None.  ANESTHESIA:  General.  ESTIMATED BLOOD LOSS:  Minimal.  COMPLICATIONS:  None.  FINDINGS:  Grossly normal uterus, tubes, and ovaries x2.  DESCRIPTION OF PROCEDURE:  After informed consent was obtained, the patient was taken to the operating room where anesthesia was induced and found to be adequate.  The patient was placed in dorsal lithotomy position and prepped and draped in the usual sterile fashion.  A speculum was placed in the vagina.  Anterior lip of cervix was grasped with a single-tooth tenaculum.  The Hulka uterine manipulator was then introduced into the uterine cavity.  The speculum and tenaculum were removed from the patient's vagina.  Surgeon's gloves were then changed and attention was turned to the abdomen.  A 1-cm umbilical incision was made with a scalpel and carried down to the underlying layer of fascia. The fascia was grasped with Kocher clamps, tented up, and entered sharply with Metzenbaum scissors.  The fascia was then tagged with 0- Vicryl.  The 10-mm Hasson was then introduced into the abdominal cavity. Intra-abdominal placement was confirmed using the laparoscope. Pneumoperitoneum was achieved with insufflation of CO2 gas.  The above- noted findings were visualized.  Using the Kleppinger and after identifying the left fallopian tube to its fimbriated  end, the left fallopian tube was cauterized in 3 consecutive segments with complete blanching of approximately a 1.5-2 cm segment of the tube visualized. Attention was then turned to the right fallopian tube which in similar fashion was identified to its fimbriated end and the Kleppinger was utilized to burn the tube in 3 consecutive segments in order for complete blanching of again a 1.5-2 cm segment of the tube visualized. All instruments were removed from the patient's abdomen.  CO2 was released from the patient's abdomen.  The fascia was reapproximated with 0-Vicryl and the skin was closed with 4-0 Monocryl.  The patient tolerated the procedure well.  Sponge, lap, and needle count were correct x2.  The Hulka uterine manipulator was removed from the patient's vagina.  Hemostasis was visualized at the tenaculum site.  The patient was transferred to the recovery room in stable condition.     Catalina Antigua, MD     PC/MEDQ  D:  01/18/2011  T:  01/19/2011  Job:  [id number]  Electronically Signed by Catalina Antigua  on 01/31/2011 07:00:36 PM

## 2011-02-16 ENCOUNTER — Ambulatory Visit: Payer: Medicaid Other | Admitting: Obstetrics and Gynecology

## 2011-02-16 DIAGNOSIS — Z09 Encounter for follow-up examination after completed treatment for conditions other than malignant neoplasm: Secondary | ICD-10-CM

## 2011-02-17 NOTE — Group Therapy Note (Signed)
[name] [name], [name] [name] NO.:  [id number]  MEDICAL RECORD NO.:  [id number]           PATIENT TYPE:  A  [facility]ATION:  [facility]                   [facility]  PHYSICIAN:  Catalina Antigua, MD     DATE OF BIRTH:  1986/05/30  DATE OF SERVICE:  02/16/2011                                 CLINIC NOTE  This is a 25 year old, G4, P 0-2-2-1 who presents today for postoperative check.  The patient is status post laparoscopic bilateral tubal ligation on Jan 18, 2011.  The patient is currently without any complaints.  Denies abnormal drainage from her incision or abdominal pain.  The patient does report, however, worsening symptoms for carpal tunnel syndrome, she describes on her right wrist.  The patient states that status post pregnancy, status post a delivery of her child, the pain seems to have worsened to the point that it makes it difficult for her to carry her child, her newborn.  The patient will also be referred to a neurologist.  In the meantime, the patient was advised to wear a wrist brace to help control her symptoms.  The patient was also advised that if her symptoms worsen, perhaps a visit to the emergency room may be warranted as well.  PHYSICAL EXAMINATION:  VITAL SIGNS:  Her blood pressure is 113/72, pulse of 61, weight of 84.7 kg, and height is 64 inches. ABDOMEN:  Soft, nontender, nondistended and her umbilical incision is completely healed.  ASSESSMENT/PLAN:  This is a 25 year old who is status post laparoscopic bilateral tubal ligation on Jan 18, 2011, who is here for postop check. The patient is cleared to resume all activities of daily living.  The patient will be provided with a referral for neurology appointment for evaluation of possible worsening carpal tunnel syndrome.  The patient is otherwise due for her annual exam in December.          ______________________________ Catalina Antigua, MD    PC/MEDQ  D:  02/16/2011  T:  02/17/2011  Job:   [id number]

## 2011-06-14 LAB — URINE MICROSCOPIC-ADD ON

## 2011-06-14 LAB — CROSSMATCH

## 2011-06-14 LAB — URINALYSIS, ROUTINE W REFLEX MICROSCOPIC
Glucose, UA: NEGATIVE
Specific Gravity, Urine: 1.017
Urobilinogen, UA: 1

## 2011-06-14 LAB — CBC
HCT: 30.1 — ABNORMAL LOW
Hemoglobin: 10.5 — ABNORMAL LOW
MCHC: 34.9
MCV: 79.4
Platelets: 46 — CL
RDW: 16.9 — ABNORMAL HIGH
RDW: 17 — ABNORMAL HIGH
WBC: 9.9

## 2011-06-14 LAB — DIFFERENTIAL
Basophils Absolute: 0
Eosinophils Relative: 1
Monocytes Absolute: 0.7
Monocytes Relative: 7
Neutrophils Relative %: 76

## 2011-06-15 LAB — CBC
HCT: 29.6 — ABNORMAL LOW
Hemoglobin: 10.3 — ABNORMAL LOW
MCHC: 34.8
MCV: 78.9
Platelets: 43 — CL
RBC: 3.75 — ABNORMAL LOW
RDW: 17.2 — ABNORMAL HIGH
WBC: 9.4

## 2011-06-16 IMAGING — US US OB FOLLOW-UP
2 series · 12 of 28 positions shown · non-contrast
Comparison: none

[Series 1: us ob follow up · 26 acquisitions, 9 frames shown (1 of 2)]
[im 2/26]
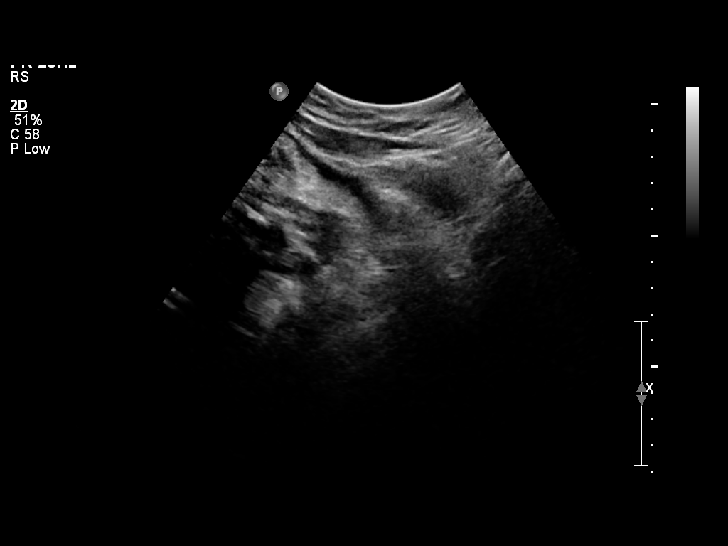
[im 4/26]
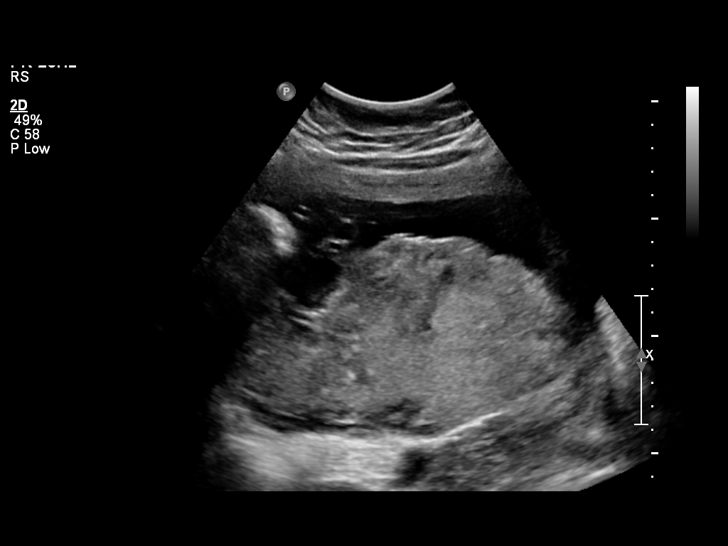
[im 7/26]
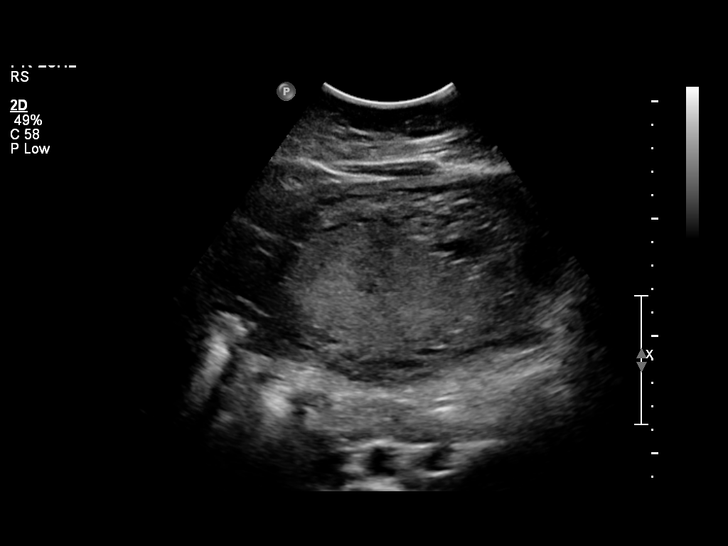
[im 11/26]
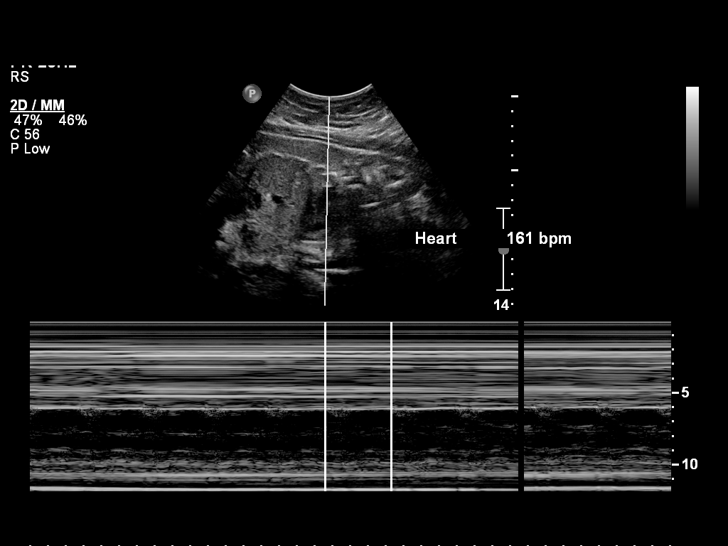
[im 14/26]
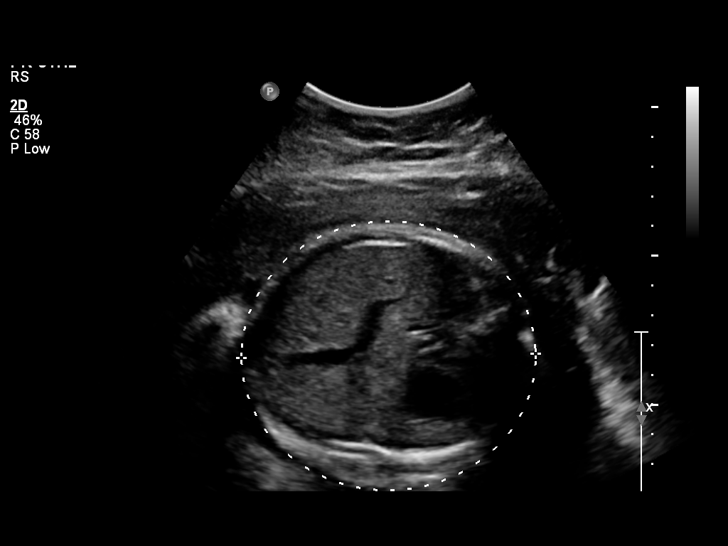
[im 16/26]
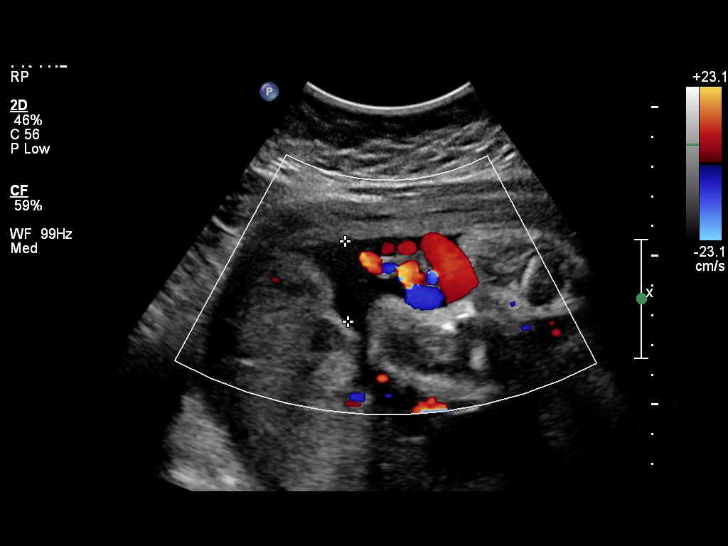
[im 20/26]
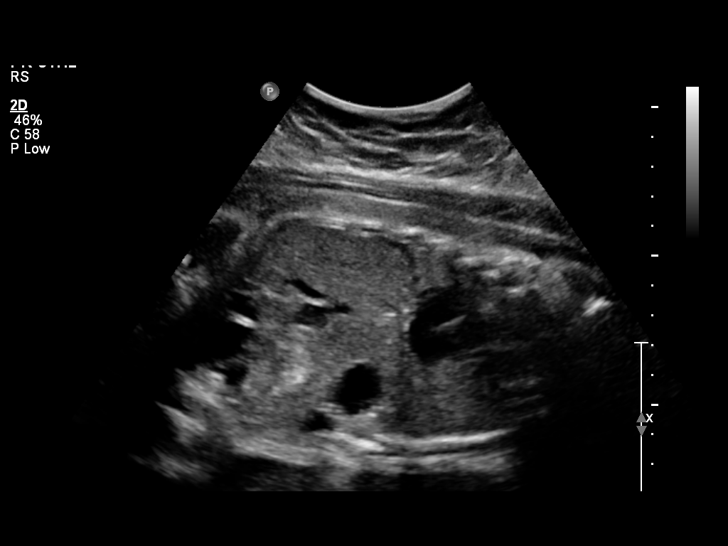
[im 23/26]
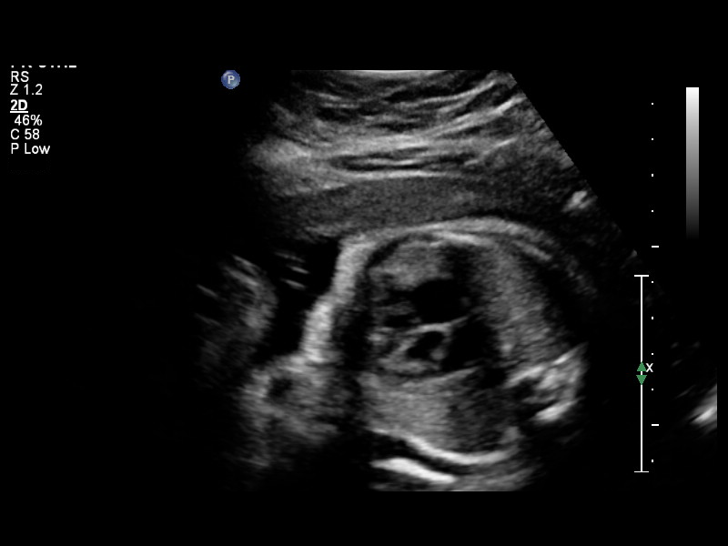
[im 26/26]
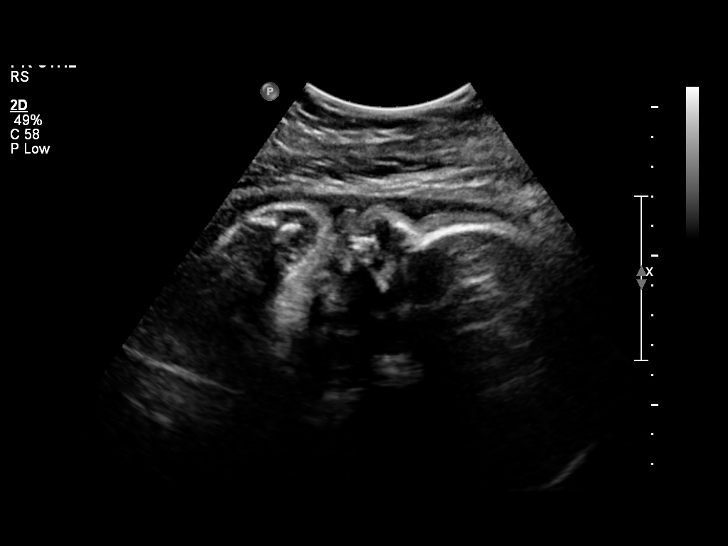

[Series 1: us ob follow up · 3 of 10 slices shown (2 of 2)]
[im 3/10]
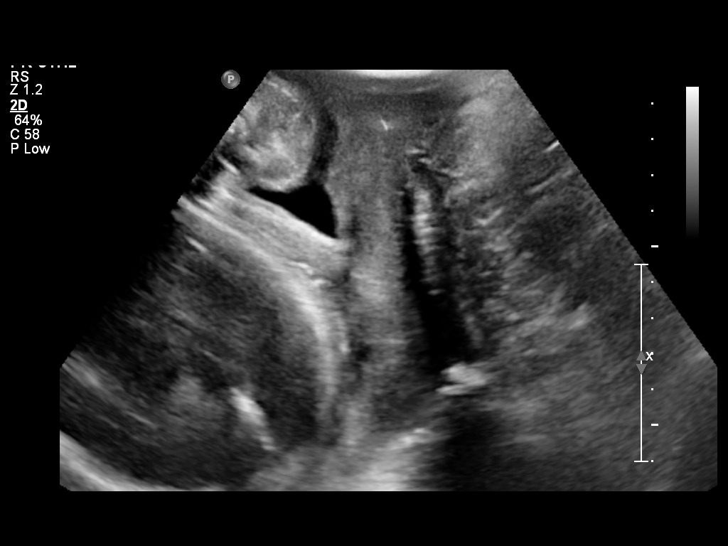
[im 6/10]
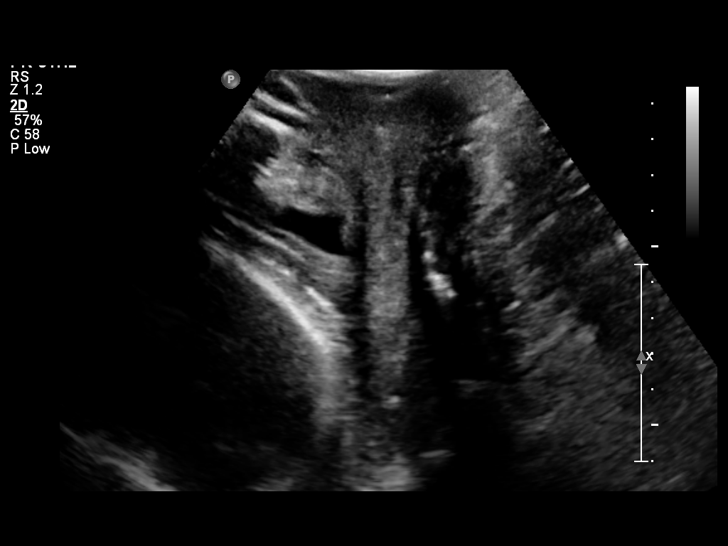
[im 8/10]
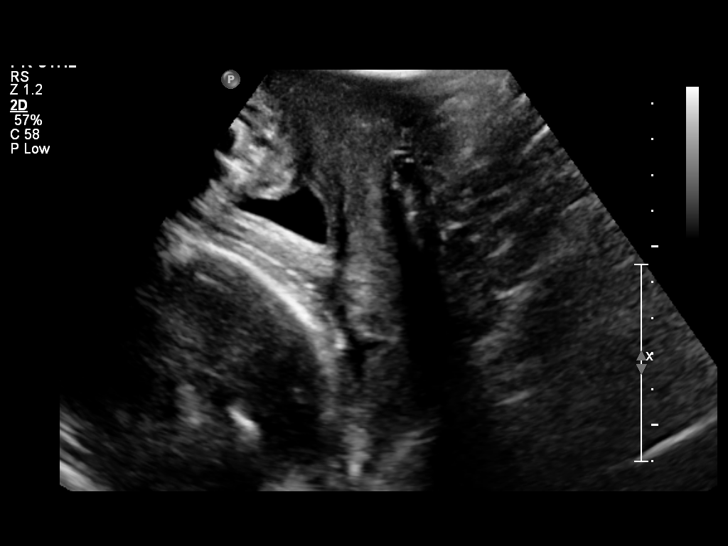

[12 of 28 positions shown; findings below may reference images not displayed]

OBSTETRICS REPORT
                      (Signed Final 11/28/2010 [DATE])

 Order#:         [id number]
Procedures

 US OB FOLLOW UP                                       76816.1
Indications

 Cervical insufficiency
 S/P cerclage
 Diabetes - Gestational
 Poor obstetric history: Previous midtrimester loss
 (16 weeks)
 Poor obstetric history: Previous IUFD (25 weeks)
Fetal Evaluation

 Fetal Heart Rate:  161                          bpm
 Cardiac Activity:  Observed
 Presentation:      Cephalic
 Placenta:          Posterior, above cervical
                    os
 P. Cord            Previously Visualized
 Insertion:

 Amniotic Fluid
 AFI FV:      Subjectively within normal limits
 AFI Sum:     11.66   cm       29  %Tile     Larg Pckt:    3.45  cm
 RUQ:   2.69    cm   RLQ:    2.88   cm    LUQ:   2.64    cm   LLQ:    3.45   cm
Biometry

 BPD:     74.5  mm     G. Age:  29w 6d                CI:        69.52   70 - 86
                                                      FL/HC:      22.9   19.1 -

 HC:     285.2  mm     G. Age:  31w 2d        4  %    HC/AC:      0.96   0.96 -

 AC:     297.5  mm     G. Age:  33w 5d       85  %    FL/BPD:     87.5   71 - 87
 FL:      65.2  mm     G. Age:  33w 4d       74  %    FL/AC:      21.9   20 - 24

 Est. FW:    2111  gm    4 lb 10 oz      72  %
Gestational Age

 U/S Today:     32w 1d                                        EDD:   01/22/11
 Best:          32w 2d     Det. By:  [facility]         EDD:   01/21/11
Anatomy

 Cranium:           Appears normal      Aortic Arch:       Basic anatomy
                                                           exam per order
 Fetal Cavum:       Appears normal      Ductal Arch:       Basic anatomy
                                                           exam per order
 Ventricles:        Previously seen     Diaphragm:         Appears normal
 Choroid Plexus:    Previously seen     Stomach:           Appears
                                                           normal, left
                                                           sided
 Cerebellum:        Previously seen     Abdomen:           Appears normal
 Posterior Fossa:   Previously seen     Abdominal Wall:    Previously seen
 Nuchal Fold:       Previously seen     Cord Vessels:      Not well
                                                           visualized
 Face:              Lips and orbits     Kidneys:           Appear normal
                    previously seen
 Heart:             Previously seen     Bladder:           Not well
                                                           visualized
 RVOT:              Basic anatomy       Spine:             Previously seen
                    exam per order
 LVOT:              Basic anatomy       Limbs:             Previously seen
                    exam per order

 Other:     Technically difficult due to advanced GA and fetal
            position.
Cervix Uterus Adnexa

 Cervical Length:    0.9      cm

 Cervix:       Measured translabially.

 Adnexa:     No abnormality visualized.
Impression

 Single living intrauterine pregnancy in cephalic presentation.
 The estimated gestational age is 32w 2d based on Early
 Ultrasound.
 Estimated fetal weight is 2111g,  72th percentile for
 gestational age of   32w 2d.
 Amniotic fluid index is 11.66 cm.
 There is funneling of the cervix, measured to be 0.9 cm in
 length.

 questions or concerns.

## 2014-01-07 ENCOUNTER — Inpatient Hospital Stay (HOSPITAL_COMMUNITY)
Admission: AD | Admit: 2014-01-07 | Discharge: 2014-01-07 | Disposition: A | Discharge status: Home / Self Care | Payer: 59 | Source: Ambulatory Visit | Attending: Obstetrics & Gynecology | Admitting: Obstetrics & Gynecology

## 2014-01-07 ENCOUNTER — Encounter (HOSPITAL_COMMUNITY): Payer: Self-pay | Admitting: *Deleted

## 2014-01-07 DIAGNOSIS — N920 Excessive and frequent menstruation with regular cycle: Secondary | ICD-10-CM | POA: Insufficient documentation

## 2014-01-07 DIAGNOSIS — N925 Other specified irregular menstruation: Secondary | ICD-10-CM

## 2014-01-07 DIAGNOSIS — R109 Unspecified abdominal pain: Secondary | ICD-10-CM | POA: Insufficient documentation

## 2014-01-07 DIAGNOSIS — D649 Anemia, unspecified: Secondary | ICD-10-CM | POA: Diagnosis not present

## 2014-01-07 DIAGNOSIS — N938 Other specified abnormal uterine and vaginal bleeding: Secondary | ICD-10-CM

## 2014-01-07 DIAGNOSIS — N949 Unspecified condition associated with female genital organs and menstrual cycle: Secondary | ICD-10-CM

## 2014-01-07 HISTORY — DX: Anemia, unspecified: D64.9

## 2014-01-07 LAB — CBC
HEMATOCRIT: 33.7 % — AB (ref 36.0–46.0)
Hemoglobin: 10.8 g/dL — ABNORMAL LOW (ref 12.0–15.0)
MCH: 23.9 pg — AB (ref 26.0–34.0)
MCHC: 32 g/dL (ref 30.0–36.0)
MCV: 74.7 fL — AB (ref 78.0–100.0)
Platelets: 91 10*3/uL — ABNORMAL LOW (ref 150–400)
RBC: 4.51 MIL/uL (ref 3.87–5.11)
RDW: 16.4 % — ABNORMAL HIGH (ref 11.5–15.5)
WBC: 8 10*3/uL (ref 4.0–10.5)

## 2014-01-07 LAB — WET PREP, GENITAL
CLUE CELLS WET PREP: NONE SEEN
Trich, Wet Prep: NONE SEEN
WBC WET PREP: NONE SEEN
Yeast Wet Prep HPF POC: NONE SEEN

## 2014-01-07 LAB — POCT PREGNANCY, URINE: PREG TEST UR: NEGATIVE

## 2014-01-07 MED ORDER — KETOROLAC TROMETHAMINE 60 MG/2ML IM SOLN
60.0000 mg | Freq: Once | INTRAMUSCULAR | Status: AC
  Administered 2014-01-07: 60 mg via INTRAMUSCULAR
  Filled 2014-01-07: qty 2

## 2014-01-07 MED ORDER — NORGESTIMATE-ETH ESTRADIOL 0.25-35 MG-MCG PO TABS: 1.0000 | ORAL_TABLET | Freq: Every day | ORAL | Status: DC

## 2014-01-07 MED ORDER — IBUPROFEN 800 MG PO TABS: 800.0000 mg | ORAL_TABLET | Freq: Three times a day (TID) | ORAL | Status: DC | PRN

## 2014-01-07 NOTE — MAU Provider Note (Signed)
History     CSN: [id number]  Arrival date and time: 01/07/14 1442   First Provider Initiated Contact with Patient 01/07/14 1541      Chief Complaint  Patient presents with  . Vaginal Bleeding   HPI Comments: [name] [name] 28 y.o. G3P1 presents to MAU with several heavy menstrual cycles. She had a BTL 3 years ago and has done well until now. She is in her 3rd day of heavy cycle today and she describes that as changing a pad every 1 hour.  She is also having pain and calls it "9" on 1-10 scale. Pt denies any reasons for not taking BCps including migraine with aura, blood clots, hypertension.  Vaginal Bleeding Associated symptoms include abdominal pain.      Past Medical History  Diagnosis Date  . Anemia     Past Surgical History  Procedure Laterality Date  . Cerclage laparoscopic abdominal    . Cervical cerclage    . Arm surgery    . Tubal ligation      Family History  Problem Relation Age of Onset  . Diabetes Mother   . Hypertension Father     History  Substance Use Topics  . Smoking status: Not on file  . Smokeless tobacco: Not on file  . Alcohol Use: Not on file    Allergies: No Known Allergies  Prescriptions prior to admission  Medication Sig Dispense Refill  . b complex-vitamin c-folic acid (NEPHRO-VITE) 0.8 MG TABS tablet Take 1 tablet by mouth daily.        Review of Systems  Constitutional: Negative.   HENT: Negative.   Respiratory: Negative.   Cardiovascular: Negative.   Gastrointestinal: Positive for abdominal pain.  Genitourinary: Negative.        Heavy menses  Musculoskeletal: Negative.   Skin: Negative.   Neurological: Negative.   Psychiatric/Behavioral: Negative.    Physical Exam   Blood pressure 134/74, pulse 85, temperature 98.2 F (36.8 C), temperature source Oral, resp. rate 20, weight 111.948 kg (246 lb 12.8 oz), last menstrual period 01/05/2014, SpO2 99.00%.  Physical Exam  Constitutional: She is oriented to person, place,  and time. She appears well-developed and well-nourished. No distress.  HENT:  Head: Normocephalic and atraumatic.  Eyes: Pupils are equal, round, and reactive to light.  GI: Soft. There is tenderness.  Genitourinary:  Genital:External negative Vaginal:moderate amount blood Cervix:closed/ no CMT Bimanual:Tender/ difficult to access due to obesity   Musculoskeletal: Normal range of motion.  Neurological: She is alert and oriented to person, place, and time.  Skin: Skin is warm and dry.  Psychiatric: She has a normal mood and affect. Her behavior is normal. Judgment and thought content normal.   Results for orders placed during the hospital encounter of 01/07/14 (from the past 24 hour(s))  CBC     Status: Abnormal   Collection Time    01/07/14  3:41 PM      Result Value Ref Range   WBC 8.0  4.0 - 10.5 K/uL   RBC 4.51  3.87 - 5.11 MIL/uL   Hemoglobin 10.8 (*) 12.0 - 15.0 g/dL   HCT 40.933.7 (*) 81.136.0 - 91.446.0 %   MCV 74.7 (*) 78.0 - 100.0 fL   MCH 23.9 (*) 26.0 - 34.0 pg   MCHC 32.0  30.0 - 36.0 g/dL   RDW 78.216.4 (*) 95.611.5 - 21.315.5 %   Platelets 91 (*) 150 - 400 K/uL  WET PREP, GENITAL     Status: None  Collection Time    01/07/14  4:09 PM      Result Value Ref Range   Yeast Wet Prep HPF POC NONE SEEN  NONE SEEN   Trich, Wet Prep NONE SEEN  NONE SEEN   Clue Cells Wet Prep HPF POC NONE SEEN  NONE SEEN   WBC, Wet Prep HPF POC NONE SEEN  NONE SEEN  POCT PREGNANCY, URINE     Status: None   Collection Time    01/07/14  4:21 PM      Result Value Ref Range   Preg Test, Ur NEGATIVE  NEGATIVE     MAU Course  Procedures  MDM  CBC, Wet prep, GC, Chlamydia Toradol 60 mg IM now/ pain improved to "6"  Assessment and Plan   A: DUB  P: Orthocyclen BCP daily/ 4 refills Establish care with GYN asap Continue with MV with iron Note for work Out patient U/S for bleeding Motrin 800 mg tid prn  Doralee AlbinoLinda M Britanee Vanblarcom 01/07/2014, 4:09 PM

## 2014-01-07 NOTE — Discharge Instructions (Signed)
Abnormal Uterine Bleeding Abnormal uterine bleeding can affect women at various stages in life, including teenagers, women in their reproductive years, pregnant women, and women who have reached menopause. Several kinds of uterine bleeding are considered abnormal, including:  Bleeding or spotting between periods.   Bleeding after sexual intercourse.   Bleeding that is heavier or more than normal.   Periods that last longer than usual.  Bleeding after menopause.  Many cases of abnormal uterine bleeding are minor and simple to treat, while others are more serious. Any type of abnormal bleeding should be evaluated by your health care provider. Treatment will depend on the cause of the bleeding. HOME CARE INSTRUCTIONS Monitor your condition for any changes. The following actions may help to alleviate any discomfort you are experiencing:  Avoid the use of tampons and douches as directed by your health care provider.  Change your pads frequently. You should get regular pelvic exams and Pap tests. Keep all follow-up appointments for diagnostic tests as directed by your health care provider.  SEEK MEDICAL CARE IF:   Your bleeding lasts more than 1 week.   You feel dizzy at times.  SEEK IMMEDIATE MEDICAL CARE IF:   You pass out.   You are changing pads every 15 to 30 minutes.   You have abdominal pain.  You have a fever.   You become sweaty or weak.   You are passing large blood clots from the vagina.   You start to feel nauseous and vomit. MAKE SURE YOU:   Understand these instructions.  Will watch your condition.  Will get help right away if you are not doing well or get worse. Document Released: 09/04/2005 Document Revised: 05/07/2013 Document Reviewed: 04/03/2013 [facility] Patient Information 2014 [employer], [place].  You will be called by Ultrasound to schedule your time

## 2014-01-07 NOTE — MAU Note (Signed)
Bleeding start on Monday, as expected. Feels like losing a lot of blood last couple periods.  Feels weak.

## 2014-01-07 NOTE — MAU Note (Signed)
To restroom  First.  States bleeding is heavy, can't be pregnant, has had her tubes tied.

## 2014-01-07 NOTE — MAU Note (Signed)
Pt states she has had very heavy periods for" the last couple of periods"

## 2014-01-08 LAB — GC/CHLAMYDIA PROBE AMP
CT Probe RNA: NEGATIVE
GC PROBE AMP APTIMA: NEGATIVE

## 2014-07-20 ENCOUNTER — Encounter (HOSPITAL_COMMUNITY): Payer: Self-pay | Admitting: *Deleted

## 2014-09-01 ENCOUNTER — Telehealth: Payer: Self-pay | Admitting: Internal Medicine

## 2014-09-01 NOTE — Telephone Encounter (Signed)
S/W PATIENT AND GAVE NP APPT FOR 12/28 @ 1:45 W/DR. MOHAMED REFERRING [name] LONG, PA DX-THROMBOCYTOPENIA

## 2014-09-01 NOTE — Telephone Encounter (Signed)
CALLED PATIENT TO SCHEDULE NP APPT, PER PATIENT NOT ABLE TO TAKE TIME OFF WILL CALL BACK TO SCHEDULE APPT. LM FOR TIFFAY @ [phone] THAT PATIENT WAS NOT ABLE TO SCHEDULE NP APPT DUE TO WORK.

## 2014-09-10 ENCOUNTER — Other Ambulatory Visit: Payer: Self-pay | Admitting: Medical Oncology

## 2014-09-10 DIAGNOSIS — D696 Thrombocytopenia, unspecified: Secondary | ICD-10-CM

## 2014-09-14 ENCOUNTER — Telehealth: Payer: Self-pay | Admitting: Internal Medicine

## 2014-09-14 ENCOUNTER — Other Ambulatory Visit (HOSPITAL_BASED_OUTPATIENT_CLINIC_OR_DEPARTMENT_OTHER): Payer: 59

## 2014-09-14 ENCOUNTER — Other Ambulatory Visit: Payer: Self-pay | Admitting: Internal Medicine

## 2014-09-14 ENCOUNTER — Ambulatory Visit: Payer: 59

## 2014-09-14 ENCOUNTER — Encounter: Payer: Self-pay | Admitting: Internal Medicine

## 2014-09-14 ENCOUNTER — Ambulatory Visit (HOSPITAL_BASED_OUTPATIENT_CLINIC_OR_DEPARTMENT_OTHER): Payer: 59 | Admitting: Internal Medicine

## 2014-09-14 VITALS — BP 129/74 | HR 60 | Temp 98.6°F | Resp 19 | Ht 64.0 in | Wt 249.1 lb

## 2014-09-14 DIAGNOSIS — D509 Iron deficiency anemia, unspecified: Secondary | ICD-10-CM

## 2014-09-14 DIAGNOSIS — D6942 Congenital and hereditary thrombocytopenia purpura: Secondary | ICD-10-CM | POA: Diagnosis not present

## 2014-09-14 DIAGNOSIS — D696 Thrombocytopenia, unspecified: Secondary | ICD-10-CM

## 2014-09-14 LAB — COMPREHENSIVE METABOLIC PANEL (CC13)
ALBUMIN: 3.7 g/dL (ref 3.5–5.0)
ALT: 20 U/L (ref 0–55)
ANION GAP: 9 meq/L (ref 3–11)
AST: 17 U/L (ref 5–34)
Alkaline Phosphatase: 54 U/L (ref 40–150)
BUN: 9 mg/dL (ref 7.0–26.0)
CALCIUM: 8.6 mg/dL (ref 8.4–10.4)
CHLORIDE: 107 meq/L (ref 98–109)
CO2: 23 mEq/L (ref 22–29)
CREATININE: 0.8 mg/dL (ref 0.6–1.1)
GLUCOSE: 122 mg/dL (ref 70–140)
POTASSIUM: 3.5 meq/L (ref 3.5–5.1)
Sodium: 140 mEq/L (ref 136–145)
Total Bilirubin: 0.25 mg/dL (ref 0.20–1.20)
Total Protein: 6.9 g/dL (ref 6.4–8.3)

## 2014-09-14 LAB — CBC WITH DIFFERENTIAL/PLATELET
BASO%: 0.1 % (ref 0.0–2.0)
BASOS ABS: 0 10*3/uL (ref 0.0–0.1)
EOS ABS: 0.1 10*3/uL (ref 0.0–0.5)
EOS%: 2 % (ref 0.0–7.0)
HCT: 33.6 % — ABNORMAL LOW (ref 34.8–46.6)
HEMOGLOBIN: 10.9 g/dL — AB (ref 11.6–15.9)
LYMPH%: 36.4 % (ref 14.0–49.7)
MCH: 24.5 pg — AB (ref 25.1–34.0)
MCHC: 32.4 g/dL (ref 31.5–36.0)
MCV: 75.7 fL — ABNORMAL LOW (ref 79.5–101.0)
MONO#: 0.4 10*3/uL (ref 0.1–0.9)
MONO%: 5.6 % (ref 0.0–14.0)
NEUT%: 55.9 % (ref 38.4–76.8)
NEUTROS ABS: 3.9 10*3/uL (ref 1.5–6.5)
NRBC: 0 % (ref 0–0)
Platelets: 73 10*3/uL — ABNORMAL LOW (ref 145–400)
RBC: 4.44 10*6/uL (ref 3.70–5.45)
RDW: 15.8 % — ABNORMAL HIGH (ref 11.2–14.5)
WBC: 6.9 10*3/uL (ref 3.9–10.3)
lymph#: 2.5 10*3/uL (ref 0.9–3.3)

## 2014-09-14 NOTE — Telephone Encounter (Signed)
, °

## 2014-09-14 NOTE — Progress Notes (Signed)
[facility] [phone]   Fax:(336) [phone]  CONSULT NOTE  REFERRING PHYSICIAN: SCOTT LONG, PA  REASON FOR CONSULTATION:  28 years old African-American female for reevaluation for persistent thrombocytopenia  HPI [name] [name] is a 28 y.o. 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 The patient was referred back to me today for reevaluation of her persistent thrombocytopenia. She was seen previously in 2011 for the same problem when she was pregnant. She had extensive workup performed at that time [facility] by Dr. Kerry DoryAlice Ma. 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 The patient was given guidance for management of her platelets count during the delivery and she underwent uneventible delivery and her baby is now about 43945 years old. She continues to have mild bruises and occasional nosebleed as well as occasional gum bleed with brushing. She denied having any large areas ecchymosis. She denied having any rectal bleeding. She also has a maternal family history of thrombocytopenia including 2 brothers and her mother. One of her brother is currently undergoing treatment for ITP in [place]. The patient is feeling fine today except for weakness secondary to anemia from heavy menstruation. She denied having any significant nausea or vomiting, no abdominal pain, no fever or chills. The patient denied having any significant chest pain, shortness of breath, cough or hemoptysis. She denied having any weight loss or night sweats. Family history as mentioned above is significant for brother and mother with bleeding issues and questionable ITP. Maternal uncle with stomach cancer and paternal grand mother with bone cancer. The patient is single and has one child 68945 years old. She works for [employer]. She has no history of smoking but drinks alcohol occasionally and no history of drug abuse.  HPI  Past Medical History  Diagnosis Date  . Anemia     Past Surgical History  Procedure Laterality Date  . Cerclage laparoscopic abdominal    . Cervical cerclage    . Arm surgery    . Tubal ligation      Family History  Problem Relation Age of Onset  . Diabetes Mother   . Hypertension Father     Social History History  Substance Use Topics  . Smoking status: Not on file  . Smokeless tobacco: Not on file  . Alcohol Use: Not on file    No Known Allergies  Current Outpatient Prescriptions  Medication Sig Dispense Refill  . b complex-vitamin c-folic acid (NEPHRO-VITE) 0.8 MG TABS tablet Take 1 tablet by mouth daily.    . norgestimate-ethinyl estradiol (ORTHO-CYCLEN, 28,) 0.25-35 MG-MCG tablet Take 1 tablet by mouth daily. 1 Package 4   No current facility-administered medications for this visit.    Review of Systems  Constitutional: positive for fatigue Eyes: negative Ears, nose, mouth, throat, and face: negative Respiratory: negative Cardiovascular: negative Gastrointestinal: negative Genitourinary:negative Integument/breast: negative Hematologic/lymphatic: positive for easy bruising Musculoskeletal:negative Neurological: negative Behavioral/Psych: negative Endocrine: negative Allergic/Immunologic: negative  Physical Exam  JYN:WGNFARAL:alert, healthy, no distress, well nourished and well developed SKIN: skin color, texture, turgor are normal, no rashes or significant lesions HEAD: Normocephalic, No masses, lesions, tenderness or abnormalities EYES: normal, PERRLA EARS: External ears normal, Canals clear OROPHARYNX:no exudate, no erythema and lips, buccal mucosa, and tongue normal  NECK: supple, no adenopathy, no JVD LYMPH:  no palpable lymphadenopathy, no hepatosplenomegaly BREAST:not examined LUNGS: clear to auscultation ,  and palpation HEART: regular  rate & rhythm and no murmurs ABDOMEN:abdomen soft, non-tender, obese, normal bowel sounds and no masses or organomegaly BACK: Back symmetric, no curvature., No CVA tenderness EXTREMITIES:no joint deformities, effusion, or inflammation, no edema, no skin discoloration, no clubbing  NEURO: alert & oriented x 3 with fluent speech, no focal motor/sensory deficits  PERFORMANCE STATUS: ECOG 1  LABORATORY DATA: Lab Results  Component Value Date   WBC 6.9 09/14/2014   HGB 10.9* 09/14/2014   HCT 33.6* 09/14/2014   MCV 75.7* 09/14/2014   PLT 73* 09/14/2014      Chemistry      Component Value Date/Time   NA 140 09/14/2014 1407   NA 134* 08/06/2010 0425   K 3.5 09/14/2014 1407   K 3.8 08/06/2010 0425   CL 105 08/06/2010 0425   CO2 23 09/14/2014 1407   CO2 21 08/06/2010 0425   BUN 9.0 09/14/2014 1407   BUN 3* 08/06/2010 0425   CREATININE 0.8 09/14/2014 1407   CREATININE 0.54 08/06/2010 0425      Component Value Date/Time   CALCIUM 8.6 09/14/2014 1407   CALCIUM 8.9 08/06/2010 0425   ALKPHOS 54 09/14/2014 1407   ALKPHOS 36* 08/06/2010 0425   AST 17 09/14/2014 1407   AST 21 08/06/2010 0425   ALT 20 09/14/2014 1407   ALT 11 08/06/2010 0425   BILITOT 0.25 09/14/2014 1407   BILITOT 0.4 08/06/2010 0425       RADIOGRAPHIC STUDIES: No results found.  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 She has biliary more than 3 years ago and it was uncomplicated with her current thrombocytopenia. Her platelets count has been fluctuating over the last few years.   PLAN: I had a lengthy discussion with the patient today about her condition. Her platelets count today are 73,000. The patient has no significant bleeding issues. I don't see a need to proceed with any treatment at this point. I recommended for the patient to  continue on observation with repeat CBC and LDH in one month period. I would consider the patient for treatment only if she has any significant bleeding issues, otherwise she will continue on observation. For the iron deficiency anemia, I recommended for the patient to start taking over the counter iron tablets 1-2 tablets every day. She was advised to call immediately if she has any concerning symptoms in the interval.  The patient voices understanding of current disease status and treatment options and is in agreement with the current care plan.  All questions were answered. The patient knows to call the clinic with any problems, questions or concerns. We can certainly see the patient much sooner if necessary.  Thank you so much for allowing me to participate in the [facility] of [facility]. I will continue to follow up the patient with you and assist in her care.  I spent 40 minutes counseling the patient face to face. The total time spent in the appointment was 60 minutes.  Disclaimer: This note was dictated with voice recognition software. Similar sounding words can inadvertently be transcribed and may not be corrected upon review.   Stephany Poorman K. 09/14/2014, 5:05 PM

## 2014-10-13 ENCOUNTER — Ambulatory Visit (HOSPITAL_BASED_OUTPATIENT_CLINIC_OR_DEPARTMENT_OTHER): Payer: 59 | Admitting: Internal Medicine

## 2014-10-13 ENCOUNTER — Other Ambulatory Visit (HOSPITAL_BASED_OUTPATIENT_CLINIC_OR_DEPARTMENT_OTHER): Payer: 59

## 2014-10-13 ENCOUNTER — Telehealth: Payer: Self-pay | Admitting: Internal Medicine

## 2014-10-13 ENCOUNTER — Encounter: Payer: Self-pay | Admitting: Internal Medicine

## 2014-10-13 VITALS — BP 123/71 | HR 67 | Temp 98.1°F | Resp 18 | Ht 64.0 in | Wt 249.1 lb

## 2014-10-13 DIAGNOSIS — D6942 Congenital and hereditary thrombocytopenia purpura: Secondary | ICD-10-CM

## 2014-10-13 DIAGNOSIS — D696 Thrombocytopenia, unspecified: Secondary | ICD-10-CM

## 2014-10-13 LAB — CBC WITH DIFFERENTIAL/PLATELET
BASO%: 0.3 % (ref 0.0–2.0)
Basophils Absolute: 0 10*3/uL (ref 0.0–0.1)
EOS%: 2.8 % (ref 0.0–7.0)
Eosinophils Absolute: 0.2 10*3/uL (ref 0.0–0.5)
HEMATOCRIT: 35.5 % (ref 34.8–46.6)
HEMOGLOBIN: 11.4 g/dL — AB (ref 11.6–15.9)
LYMPH#: 2.5 10*3/uL (ref 0.9–3.3)
LYMPH%: 36.9 % (ref 14.0–49.7)
MCH: 24.6 pg — ABNORMAL LOW (ref 25.1–34.0)
MCHC: 32.1 g/dL (ref 31.5–36.0)
MCV: 76.7 fL — AB (ref 79.5–101.0)
MONO#: 0.5 10*3/uL (ref 0.1–0.9)
MONO%: 7.2 % (ref 0.0–14.0)
NEUT#: 3.6 10*3/uL (ref 1.5–6.5)
NEUT%: 52.8 % (ref 38.4–76.8)
Platelets: 82 10*3/uL — ABNORMAL LOW (ref 145–400)
RBC: 4.63 10*6/uL (ref 3.70–5.45)
RDW: 16.2 % — ABNORMAL HIGH (ref 11.2–14.5)
WBC: 6.8 10*3/uL (ref 3.9–10.3)

## 2014-10-13 LAB — LACTATE DEHYDROGENASE (CC13): LDH: 184 U/L (ref 125–245)

## 2014-10-13 NOTE — Telephone Encounter (Signed)
lvm for pt regarding to July appt.....mailed pt appt sched/avs adnletter

## 2014-10-13 NOTE — Progress Notes (Signed)
[facility] Telephone:([phone]) [phone]   Fax:(336) [phone]  OFFICE PROGRESS NOTE  No primary care provider on file. No primary provider on file.  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 female returns to the clinic today for follow-up visit. The patient is feeling fine today with no specific complaints except for occasional nosebleed and migraine headache. She takes Excedrin for her headache. She denied having any significant bruises or ecchymosis. She denied having any other bleeding issues. The patient denied having any significant fever or chills, no nausea or vomiting, no chest pain, shortness breath, cough or hemoptysis. She had repeat CBC performed earlier today and she is here for evaluation and discussion of her lab results.  MEDICAL HISTORY: Past Medical History  Diagnosis Date  . Anemia     ALLERGIES:  is allergic to strawberry.  MEDICATIONS:  Current Outpatient Prescriptions  Medication Sig Dispense Refill  . b complex-vitamin c-folic acid (NEPHRO-VITE) 0.8 MG TABS tablet Take 1 tablet by mouth daily.    Marland Kitchen. EPIPEN 2-PAK 0.3 MG/0.3ML SOAJ injection   0  . norgestimate-ethinyl estradiol (ORTHO-CYCLEN, 28,) 0.25-35 MG-MCG tablet Take 1 tablet by mouth daily. (Patient not taking: Reported on 10/13/2014) 1 Package 4   No current facility-administered medications for this visit.    SURGICAL HISTORY:  Past Surgical History  Procedure Laterality Date  . Cerclage laparoscopic abdominal    . Cervical cerclage    . Arm surgery    . Tubal ligation      REVIEW OF SYSTEMS:  A comprehensive review of systems was negative except for: Ears, nose, mouth, throat, and face: positive for epistaxis   PHYSICAL EXAMINATION: General appearance: alert, cooperative and no distress Head: Normocephalic, without obvious abnormality, atraumatic Neck:  no adenopathy, no JVD, supple, symmetrical, trachea midline and thyroid not enlarged, symmetric, no tenderness/mass/nodules Lymph nodes: Cervical, supraclavicular, and axillary nodes normal. Resp: clear to auscultation bilaterally Back: symmetric, no curvature. ROM normal. No CVA tenderness. Cardio: regular rate and rhythm, S1, S2 normal, no murmur, click, rub or gallop GI: soft, non-tender; bowel sounds normal; no masses,  no organomegaly Extremities: extremities normal, atraumatic, no cyanosis or edema  ECOG PERFORMANCE STATUS: 0 - Asymptomatic  Blood pressure 123/71, pulse 67, temperature 98.1 F (36.7 C), temperature source Oral, resp. rate 18, height 5\' 4"  (1.626 m), weight 249 lb 1.6 oz (112.991 kg), SpO2 100 %.  LABORATORY DATA: Lab Results  Component Value Date   WBC 6.8 10/13/2014   HGB 11.4* 10/13/2014   HCT 35.5 10/13/2014   MCV 76.7* 10/13/2014   PLT 82* 10/13/2014      Chemistry      Component Value Date/Time   NA 140 09/14/2014 1407   NA 134* 08/06/2010 0425   K 3.5 09/14/2014 1407   K 3.8 08/06/2010 0425   CL 105 08/06/2010 0425   CO2 23 09/14/2014 1407   CO2 21 08/06/2010 0425   BUN 9.0 09/14/2014 1407   BUN 3* 08/06/2010 0425   CREATININE 0.8 09/14/2014 1407   CREATININE 0.54 08/06/2010 0425      Component Value Date/Time   CALCIUM 8.6 09/14/2014 1407   CALCIUM 8.9 08/06/2010 0425   ALKPHOS 54 09/14/2014 1407   ALKPHOS 36* 08/06/2010 0425   AST 17 09/14/2014 1407   AST 21 08/06/2010 0425   ALT 20 09/14/2014 1407   ALT 11 08/06/2010 0425  BILITOT 0.25 09/14/2014 1407   BILITOT 0.4 08/06/2010 0425       RADIOGRAPHIC STUDIES: No results found.  ASSESSMENT AND PLAN: This is a very pleasant 29 years old African-American female with congenital thrombocytopenia, , macrothrombocytopenia. She also has a family history of ITP. Her platelets count are 82,000 today. The patient has no bleeding issues except for occasional epistaxis after migraine headache  and use of Excedrin. I recommended for her to avoid Excedrin because of the aspirin content. I also recommended for the patient to see her primary care physician for treatment of her migraine headache. I will continue her on observation for now with repeat CBC, comprehensive metabolic panel and LDH in 6 months. She was advised to call immediately if she has any concerning symptoms in the interval. The patient voices understanding of current disease status and treatment options and is in agreement with the current care plan.  All questions were answered. The patient knows to call the clinic with any problems, questions or concerns. We can certainly see the patient much sooner if necessary.  Disclaimer: This note was dictated with voice recognition software. Similar sounding words can inadvertently be transcribed and may not be corrected upon review.

## 2015-04-13 ENCOUNTER — Other Ambulatory Visit: Payer: 59

## 2015-04-13 ENCOUNTER — Ambulatory Visit: Payer: 59 | Admitting: Internal Medicine

## 2016-02-11 ENCOUNTER — Observation Stay (HOSPITAL_COMMUNITY): Payer: Medicaid Other | Admitting: Anesthesiology

## 2016-02-11 ENCOUNTER — Emergency Department (HOSPITAL_COMMUNITY): Payer: Medicaid Other

## 2016-02-11 ENCOUNTER — Encounter (HOSPITAL_COMMUNITY)
Admission: EM | Disposition: A | Discharge status: Home / Self Care | Payer: Self-pay | Source: Home / Self Care | Attending: Emergency Medicine

## 2016-02-11 ENCOUNTER — Encounter (HOSPITAL_COMMUNITY): Payer: Self-pay | Admitting: Emergency Medicine

## 2016-02-11 ENCOUNTER — Observation Stay (HOSPITAL_COMMUNITY): Payer: Medicaid Other

## 2016-02-11 ENCOUNTER — Observation Stay (HOSPITAL_COMMUNITY)
Admission: EM | Admit: 2016-02-11 | Discharge: 2016-02-12 | Disposition: A | Discharge status: Home / Self Care | Payer: Medicaid Other | Attending: General Surgery | Admitting: General Surgery

## 2016-02-11 DIAGNOSIS — K8000 Calculus of gallbladder with acute cholecystitis without obstruction: Secondary | ICD-10-CM | POA: Diagnosis not present

## 2016-02-11 DIAGNOSIS — D6942 Congenital and hereditary thrombocytopenia purpura: Secondary | ICD-10-CM | POA: Insufficient documentation

## 2016-02-11 DIAGNOSIS — K8020 Calculus of gallbladder without cholecystitis without obstruction: Secondary | ICD-10-CM | POA: Diagnosis present

## 2016-02-11 HISTORY — DX: Obesity, unspecified: E66.9

## 2016-02-11 HISTORY — PX: CHOLECYSTECTOMY: SHX55

## 2016-02-11 LAB — CBC
HEMATOCRIT: 34.5 % — AB (ref 36.0–46.0)
HEMOGLOBIN: 11 g/dL — AB (ref 12.0–15.0)
MCH: 24.8 pg — ABNORMAL LOW (ref 26.0–34.0)
MCHC: 31.9 g/dL (ref 30.0–36.0)
MCV: 77.9 fL — AB (ref 78.0–100.0)
Platelets: 72 10*3/uL — ABNORMAL LOW (ref 150–400)
RBC: 4.43 MIL/uL (ref 3.87–5.11)
RDW: 14.8 % (ref 11.5–15.5)
WBC: 4.7 10*3/uL (ref 4.0–10.5)

## 2016-02-11 LAB — COMPREHENSIVE METABOLIC PANEL
ALBUMIN: 4 g/dL (ref 3.5–5.0)
ALT: 629 U/L — ABNORMAL HIGH (ref 14–54)
ANION GAP: 8 (ref 5–15)
AST: 997 U/L — ABNORMAL HIGH (ref 15–41)
Alkaline Phosphatase: 100 U/L (ref 38–126)
BILIRUBIN TOTAL: 0.9 mg/dL (ref 0.3–1.2)
BUN: 9 mg/dL (ref 6–20)
CHLORIDE: 103 mmol/L (ref 101–111)
CO2: 24 mmol/L (ref 22–32)
Calcium: 9 mg/dL (ref 8.9–10.3)
Creatinine, Ser: 0.78 mg/dL (ref 0.44–1.00)
GFR calc Af Amer: >60 mL/min (ref >60)
GFR calc non Af Amer: >60 mL/min (ref >60)
GLUCOSE: 138 mg/dL — AB (ref 65–99)
POTASSIUM: 3.7 mmol/L (ref 3.5–5.1)
SODIUM: 135 mmol/L (ref 135–145)
TOTAL PROTEIN: 7.1 g/dL (ref 6.5–8.1)

## 2016-02-11 LAB — URINE MICROSCOPIC-ADD ON

## 2016-02-11 LAB — LIPASE, BLOOD: LIPASE: 27 U/L (ref 11–51)

## 2016-02-11 LAB — URINALYSIS, ROUTINE W REFLEX MICROSCOPIC
Glucose, UA: NEGATIVE mg/dL
Ketones, ur: 15 mg/dL — AB
NITRITE: NEGATIVE
PH: 7 (ref 5.0–8.0)
Protein, ur: 30 mg/dL — AB
SPECIFIC GRAVITY, URINE: 1.02 (ref 1.005–1.030)

## 2016-02-11 LAB — SURGICAL PCR SCREEN
MRSA, PCR: NEGATIVE
STAPHYLOCOCCUS AUREUS: POSITIVE — AB

## 2016-02-11 LAB — POC URINE PREG, ED: Preg Test, Ur: NEGATIVE

## 2016-02-11 SURGERY — LAPAROSCOPIC CHOLECYSTECTOMY WITH INTRAOPERATIVE CHOLANGIOGRAM
Anesthesia: General | Site: Abdomen

## 2016-02-11 MED ORDER — PROCHLORPERAZINE EDISYLATE 5 MG/ML IJ SOLN: 10.0000 mg | Freq: Once | INTRAMUSCULAR | Status: DC

## 2016-02-11 MED ORDER — KCL IN DEXTROSE-NACL 20-5-0.45 MEQ/L-%-% IV SOLN
INTRAVENOUS | Status: DC
  Administered 2016-02-11 – 2016-02-12 (×2): via INTRAVENOUS
  Filled 2016-02-11 (×2): qty 1000

## 2016-02-11 MED ORDER — LACTATED RINGERS IV SOLN
INTRAVENOUS | Status: DC
  Administered 2016-02-11: 13:00:00 via INTRAVENOUS

## 2016-02-11 MED ORDER — DIPHENHYDRAMINE HCL 25 MG PO CAPS: 25.0000 mg | ORAL_CAPSULE | Freq: Four times a day (QID) | ORAL | Status: DC | PRN

## 2016-02-11 MED ORDER — ONDANSETRON HCL 4 MG/2ML IJ SOLN
4.0000 mg | Freq: Once | INTRAMUSCULAR | Status: AC
  Administered 2016-02-11: 4 mg via INTRAVENOUS
  Filled 2016-02-11: qty 2

## 2016-02-11 MED ORDER — FENTANYL CITRATE (PF) 100 MCG/2ML IJ SOLN
INTRAMUSCULAR | Status: DC | PRN
  Administered 2016-02-11: 100 ug via INTRAVENOUS

## 2016-02-11 MED ORDER — ALBUTEROL SULFATE (2.5 MG/3ML) 0.083% IN NEBU: 2.5000 mg | INHALATION_SOLUTION | Freq: Four times a day (QID) | RESPIRATORY_TRACT | Status: DC | PRN

## 2016-02-11 MED ORDER — MORPHINE SULFATE (PF) 2 MG/ML IV SOLN: 1.0000 mg | INTRAVENOUS | Status: DC | PRN

## 2016-02-11 MED ORDER — 0.9 % SODIUM CHLORIDE (POUR BTL) OPTIME
TOPICAL | Status: DC | PRN
  Administered 2016-02-11: 1000 mL

## 2016-02-11 MED ORDER — DEXAMETHASONE SODIUM PHOSPHATE 4 MG/ML IJ SOLN
INTRAMUSCULAR | Status: DC | PRN
  Administered 2016-02-11: 8 mg via INTRAVENOUS

## 2016-02-11 MED ORDER — DEXTROSE 5 % IV SOLN: 2.0000 g | INTRAVENOUS | Status: DC

## 2016-02-11 MED ORDER — DIPHENHYDRAMINE HCL 50 MG/ML IJ SOLN: 25.0000 mg | Freq: Four times a day (QID) | INTRAMUSCULAR | Status: DC | PRN

## 2016-02-11 MED ORDER — CEFAZOLIN SODIUM-DEXTROSE 2-3 GM-% IV SOLR
2.0000 g | Freq: Once | INTRAVENOUS | Status: AC
  Administered 2016-02-11: 2 g via INTRAVENOUS

## 2016-02-11 MED ORDER — PANTOPRAZOLE SODIUM 40 MG IV SOLR
40.0000 mg | Freq: Every day | INTRAVENOUS | Status: DC
  Administered 2016-02-11: 40 mg via INTRAVENOUS
  Filled 2016-02-11: qty 40

## 2016-02-11 MED ORDER — ONDANSETRON HCL 4 MG/2ML IJ SOLN
4.0000 mg | Freq: Four times a day (QID) | INTRAMUSCULAR | Status: DC | PRN
  Administered 2016-02-11 – 2016-02-12 (×2): 4 mg via INTRAVENOUS
  Filled 2016-02-11 (×2): qty 2

## 2016-02-11 MED ORDER — BUPIVACAINE-EPINEPHRINE (PF) 0.25% -1:200000 IJ SOLN
INTRAMUSCULAR | Status: AC
  Filled 2016-02-11: qty 30

## 2016-02-11 MED ORDER — LIDOCAINE 2% (20 MG/ML) 5 ML SYRINGE
INTRAMUSCULAR | Status: AC
  Filled 2016-02-11: qty 5

## 2016-02-11 MED ORDER — ALBUTEROL SULFATE HFA 108 (90 BASE) MCG/ACT IN AERS: 1.0000 | INHALATION_SPRAY | Freq: Four times a day (QID) | RESPIRATORY_TRACT | Status: DC | PRN

## 2016-02-11 MED ORDER — SODIUM CHLORIDE 0.9 % IR SOLN
Status: DC | PRN
  Administered 2016-02-11: 1000 mL

## 2016-02-11 MED ORDER — ONDANSETRON 4 MG PO TBDP
4.0000 mg | ORAL_TABLET | Freq: Four times a day (QID) | ORAL | Status: DC | PRN
  Administered 2016-02-12 (×2): 4 mg via ORAL
  Filled 2016-02-11 (×2): qty 1

## 2016-02-11 MED ORDER — OXYCODONE-ACETAMINOPHEN 5-325 MG PO TABS
1.0000 | ORAL_TABLET | ORAL | Status: DC | PRN
  Administered 2016-02-11 – 2016-02-12 (×4): 2 via ORAL
  Filled 2016-02-11 (×5): qty 2

## 2016-02-11 MED ORDER — SUGAMMADEX SODIUM 200 MG/2ML IV SOLN
INTRAVENOUS | Status: DC | PRN
  Administered 2016-02-11: 200 mg via INTRAVENOUS

## 2016-02-11 MED ORDER — PROPOFOL 10 MG/ML IV BOLUS
INTRAVENOUS | Status: AC
  Filled 2016-02-11: qty 20

## 2016-02-11 MED ORDER — MORPHINE SULFATE (PF) 4 MG/ML IV SOLN
4.0000 mg | Freq: Once | INTRAVENOUS | Status: AC
Start: 2016-02-11 — End: 2016-02-11
  Administered 2016-02-11: 4 mg via INTRAVENOUS
  Filled 2016-02-11: qty 1

## 2016-02-11 MED ORDER — MIDAZOLAM HCL 2 MG/2ML IJ SOLN
INTRAMUSCULAR | Status: AC
  Filled 2016-02-11: qty 2

## 2016-02-11 MED ORDER — MORPHINE SULFATE (PF) 2 MG/ML IV SOLN
2.0000 mg | INTRAVENOUS | Status: DC | PRN
  Administered 2016-02-11 (×3): 4 mg via INTRAVENOUS
  Filled 2016-02-11 (×3): qty 2

## 2016-02-11 MED ORDER — PROPOFOL 10 MG/ML IV BOLUS
INTRAVENOUS | Status: DC | PRN
  Administered 2016-02-11: 30 mg via INTRAVENOUS
  Administered 2016-02-11: 200 mg via INTRAVENOUS

## 2016-02-11 MED ORDER — MIDAZOLAM HCL 5 MG/5ML IJ SOLN
INTRAMUSCULAR | Status: DC | PRN
  Administered 2016-02-11: 2 mg via INTRAVENOUS

## 2016-02-11 MED ORDER — SODIUM CHLORIDE 0.9 % IV BOLUS (SEPSIS)
1000.0000 mL | Freq: Once | INTRAVENOUS | Status: AC
  Administered 2016-02-11: 1000 mL via INTRAVENOUS

## 2016-02-11 MED ORDER — BUPIVACAINE-EPINEPHRINE 0.25% -1:200000 IJ SOLN
INTRAMUSCULAR | Status: DC | PRN
  Administered 2016-02-11: 20 mL

## 2016-02-11 MED ORDER — IOPAMIDOL (ISOVUE-300) INJECTION 61%
INTRAVENOUS | Status: AC
  Filled 2016-02-11: qty 50

## 2016-02-11 MED ORDER — SUCCINYLCHOLINE CHLORIDE 20 MG/ML IJ SOLN
INTRAMUSCULAR | Status: DC | PRN
  Administered 2016-02-11: 100 mg via INTRAVENOUS
  Administered 2016-02-11: 20 mg via INTRAVENOUS

## 2016-02-11 MED ORDER — CEFTRIAXONE SODIUM 2 G IJ SOLR
2.0000 g | INTRAMUSCULAR | Status: DC
  Administered 2016-02-11 – 2016-02-12 (×2): 2 g via INTRAVENOUS
  Filled 2016-02-11 (×3): qty 2

## 2016-02-11 MED ORDER — ONDANSETRON HCL 4 MG/2ML IJ SOLN
INTRAMUSCULAR | Status: DC | PRN
  Administered 2016-02-11: 4 mg via INTRAVENOUS

## 2016-02-11 MED ORDER — ROCURONIUM BROMIDE 100 MG/10ML IV SOLN
INTRAVENOUS | Status: DC | PRN
  Administered 2016-02-11: 20 mg via INTRAVENOUS

## 2016-02-11 MED ORDER — KCL IN DEXTROSE-NACL 20-5-0.45 MEQ/L-%-% IV SOLN
INTRAVENOUS | Status: DC
  Administered 2016-02-11: 07:00:00 via INTRAVENOUS
  Filled 2016-02-11: qty 1000

## 2016-02-11 MED ORDER — HYDROMORPHONE HCL 1 MG/ML IJ SOLN
INTRAMUSCULAR | Status: AC
  Filled 2016-02-11: qty 1

## 2016-02-11 MED ORDER — FENTANYL CITRATE (PF) 250 MCG/5ML IJ SOLN
INTRAMUSCULAR | Status: AC
  Filled 2016-02-11: qty 5

## 2016-02-11 MED ORDER — HYDROMORPHONE HCL 1 MG/ML IJ SOLN
0.2500 mg | INTRAMUSCULAR | Status: DC | PRN
  Administered 2016-02-11: 0.25 mg via INTRAVENOUS

## 2016-02-11 MED ORDER — LIDOCAINE HCL (CARDIAC) 20 MG/ML IV SOLN
INTRAVENOUS | Status: DC | PRN
  Administered 2016-02-11: 60 mg via INTRAVENOUS

## 2016-02-11 MED ORDER — ROCURONIUM BROMIDE 50 MG/5ML IV SOLN
INTRAVENOUS | Status: AC
  Filled 2016-02-11: qty 1

## 2016-02-11 MED ORDER — SODIUM CHLORIDE 0.9 % IV SOLN
INTRAVENOUS | Status: DC | PRN
  Administered 2016-02-11: 15 mL

## 2016-02-11 SURGICAL SUPPLY — 38 items
APPLIER CLIP 5 13 M/L LIGAMAX5 (MISCELLANEOUS) ×3
BLADE SURG CLIPPER 3M 9600 (MISCELLANEOUS) IMPLANT
CANISTER SUCTION 2500CC (MISCELLANEOUS) ×3 IMPLANT
CHLORAPREP W/TINT 26ML (MISCELLANEOUS) ×3 IMPLANT
CLIP APPLIE 5 13 M/L LIGAMAX5 (MISCELLANEOUS) ×1 IMPLANT
COVER MAYO STAND STRL (DRAPES) ×3 IMPLANT
COVER SURGICAL LIGHT HANDLE (MISCELLANEOUS) ×3 IMPLANT
DRAPE C-ARM 42X72 X-RAY (DRAPES) ×3 IMPLANT
ELECT REM PT RETURN 9FT ADLT (ELECTROSURGICAL) ×3
ELECTRODE REM PT RTRN 9FT ADLT (ELECTROSURGICAL) ×1 IMPLANT
GLOVE BIO SURGEON STRL SZ8 (GLOVE) ×3 IMPLANT
GLOVE BIOGEL PI IND STRL 7.5 (GLOVE) ×1 IMPLANT
GLOVE BIOGEL PI IND STRL 8.5 (GLOVE) ×1 IMPLANT
GLOVE BIOGEL PI INDICATOR 7.5 (GLOVE) ×2
GLOVE BIOGEL PI INDICATOR 8.5 (GLOVE) ×2
GLOVE SURG SIGNA 7.5 PF LTX (GLOVE) ×3 IMPLANT
GOWN STRL REUS W/ TWL LRG LVL3 (GOWN DISPOSABLE) ×2 IMPLANT
GOWN STRL REUS W/ TWL XL LVL3 (GOWN DISPOSABLE) ×2 IMPLANT
GOWN STRL REUS W/TWL LRG LVL3 (GOWN DISPOSABLE) ×4
GOWN STRL REUS W/TWL XL LVL3 (GOWN DISPOSABLE) ×4
KIT BASIN OR (CUSTOM PROCEDURE TRAY) ×3 IMPLANT
KIT ROOM TURNOVER OR (KITS) ×3 IMPLANT
LIQUID BAND (GAUZE/BANDAGES/DRESSINGS) ×3 IMPLANT
NS IRRIG 1000ML POUR BTL (IV SOLUTION) ×3 IMPLANT
PAD ARMBOARD 7.5X6 YLW CONV (MISCELLANEOUS) ×3 IMPLANT
POUCH SPECIMEN RETRIEVAL 10MM (ENDOMECHANICALS) ×3 IMPLANT
SCISSORS LAP 5X35 DISP (ENDOMECHANICALS) ×3 IMPLANT
SET CHOLANGIOGRAPH 5 50 .035 (SET/KITS/TRAYS/PACK) ×3 IMPLANT
SET IRRIG TUBING LAPAROSCOPIC (IRRIGATION / IRRIGATOR) ×3 IMPLANT
SLEEVE ENDOPATH XCEL 5M (ENDOMECHANICALS) ×6 IMPLANT
SPECIMEN JAR SMALL (MISCELLANEOUS) ×3 IMPLANT
SUT MON AB 4-0 PC3 18 (SUTURE) ×3 IMPLANT
TOWEL OR 17X24 6PK STRL BLUE (TOWEL DISPOSABLE) ×3 IMPLANT
TOWEL OR 17X26 10 PK STRL BLUE (TOWEL DISPOSABLE) ×3 IMPLANT
TRAY LAPAROSCOPIC MC (CUSTOM PROCEDURE TRAY) ×3 IMPLANT
TROCAR XCEL BLUNT TIP 100MML (ENDOMECHANICALS) ×3 IMPLANT
TROCAR XCEL NON-BLD 5MMX100MML (ENDOMECHANICALS) ×3 IMPLANT
TUBING INSUFFLATION (TUBING) ×3 IMPLANT

## 2016-02-11 NOTE — Progress Notes (Signed)
Received patient from Ed. Patient AOx4, ambulatory, VS stable, O2Sat at 99% on RA. CNA oriented patient to room, bed controls and call light.  Informed consent signed, PCR screening done. Will endorse to day shift RN appropriately.

## 2016-02-11 NOTE — Anesthesia Postprocedure Evaluation (Signed)
Anesthesia Post Note  Patient: [name] [name]  Procedure(s) Performed: Procedure(s) (LRB): LAPAROSCOPIC CHOLECYSTECTOMY WITH INTRAOPERATIVE CHOLANGIOGRAM (N/A)  Patient location during evaluation: PACU Anesthesia Type: General Level of consciousness: awake and alert Pain management: pain level controlled Vital Signs Assessment: post-procedure vital signs reviewed and stable Respiratory status: spontaneous breathing, nonlabored ventilation, respiratory function stable and patient connected to nasal cannula oxygen Cardiovascular status: blood pressure returned to baseline and stable Postop Assessment: no signs of nausea or vomiting Anesthetic complications: no    Last Vitals:  Filed Vitals:   02/11/16 1630 02/11/16 1652  BP: 126/88 123/67  Pulse: 72 73  Temp:    Resp: 22 20    Last Pain:  Filed Vitals:   02/11/16 1654  PainSc: 7                  Jacques Willingham S

## 2016-02-11 NOTE — H&P (Signed)
[name] [name] is an 30 y.o. female.   Chief Complaint: RUQ abdominal pain HPI: Patrick ate fast food for breakfast yesterday morning. She developed right upper quadrant, crampy pain. She took some Tums and it initially got better. Later last night she awoke from sleep with worst right upper quadrant abdominal pain. She had associated diarrhea as well as nausea and vomiting. The pain persisted and she came to the emergency department for evaluation. Workup here included ultrasound which shows gallstones without evidence of cholecystitis. White blood cell count was normal. 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 Transaminases are quite elevated but bilirubin is normal. On further questioning, she does endorse multiple episodes in the past, usually every 2 months of right upper quadrant abdominal pain which she has previously attributed to gas.  Past Medical History  Diagnosis Date  . Anemia   . Obesity     Past Surgical History  Procedure Laterality Date  . Cerclage laparoscopic abdominal    . Cervical cerclage    . Arm surgery    . Tubal ligation      Family History  Problem Relation Age of Onset  . Diabetes Mother   . Hypertension Father    Social History:  reports that she has never smoked. She does not have any smokeless tobacco history on file. She reports that she drinks alcohol. She reports that she does not use illicit drugs.  She works at [employer]:  Allergies  Allergen Reactions  . Strawberry Extract Shortness Of Breath    "throat closes up"     (Not in a hospital admission)  Results for orders placed or performed during the hospital encounter of 02/11/16 (from the past 48 hour(s))  Urinalysis, Routine w reflex microscopic     Status: Abnormal   Collection Time: 02/11/16  2:23 AM  Result Value Ref Range   Color, Urine RED (A) YELLOW    Comment: BIOCHEMICALS MAY BE AFFECTED BY COLOR   APPearance TURBID (A) CLEAR    Specific Gravity, Urine 1.020 1.005 - 1.030   pH 7.0 5.0 - 8.0   Glucose, UA NEGATIVE NEGATIVE mg/dL   Hgb urine dipstick LARGE (A) NEGATIVE   Bilirubin Urine SMALL (A) NEGATIVE   Ketones, ur 15 (A) NEGATIVE mg/dL   Protein, ur 30 (A) NEGATIVE mg/dL   Nitrite NEGATIVE NEGATIVE   Leukocytes, UA SMALL (A) NEGATIVE  Urine microscopic-add on     Status: Abnormal   Collection Time: 02/11/16  2:23 AM  Result Value Ref Range   Squamous Epithelial / LPF 0-5 (A) NONE SEEN   WBC, UA 0-5 0 - 5 WBC/hpf   RBC / HPF TOO NUMEROUS TO COUNT 0 - 5 RBC/hpf   Bacteria, UA FEW (A) NONE SEEN  Lipase, blood     Status: None   Collection Time: 02/11/16  2:32 AM  Result Value Ref Range   Lipase 27 11 - 51 U/L  Comprehensive metabolic panel     Status: Abnormal   Collection Time: 02/11/16  2:32 AM  Result Value Ref Range   Sodium 135 135 - 145 mmol/L   Potassium 3.7 3.5 - 5.1 mmol/L   Chloride 103 101 - 111 mmol/L   CO2 24 22 - 32 mmol/L   Glucose, Bld 138 (H) 65 - 99 mg/dL   BUN 9 6 - 20 mg/dL   Creatinine, Ser 0.78 0.44 - 1.00 mg/dL   Calcium 9.0 8.9 - 10.3 mg/dL  Total Protein 7.1 6.5 - 8.1 g/dL   Albumin 4.0 3.5 - 5.0 g/dL   AST 997 (H) 15 - 41 U/L   ALT 629 (H) 14 - 54 U/L   Alkaline Phosphatase 100 38 - 126 U/L   Total Bilirubin 0.9 0.3 - 1.2 mg/dL   GFR calc non Af Amer >60 >60 mL/min   GFR calc Af Amer >60 >60 mL/min    Comment: (NOTE) The eGFR has been calculated using the CKD EPI equation. This calculation has not been validated in all clinical situations. eGFR's persistently <60 mL/min signify possible Chronic Kidney Disease.    Anion gap 8 5 - 15  CBC     Status: Abnormal   Collection Time: 02/11/16  2:32 AM  Result Value Ref Range   WBC 4.7 4.0 - 10.5 K/uL   RBC 4.43 3.87 - 5.11 MIL/uL   Hemoglobin 11.0 (L) 12.0 - 15.0 g/dL   HCT 34.5 (L) 36.0 - 46.0 %   MCV 77.9 (L) 78.0 - 100.0 fL   MCH 24.8 (L) 26.0 - 34.0 pg   MCHC 31.9 30.0 - 36.0 g/dL   RDW 14.8 11.5 - 15.5 %    Platelets 72 (L) 150 - 400 K/uL    Comment: SPECIMEN CHECKED FOR CLOTS PLATELET COUNT CONFIRMED BY SMEAR LARGE PLATELETS PRESENT   POC urine preg, ED     Status: None   Collection Time: 02/11/16  2:44 AM  Result Value Ref Range   Preg Test, Ur NEGATIVE NEGATIVE    Comment:        THE SENSITIVITY OF THIS METHODOLOGY IS >24 mIU/mL    US Abdomen Limited  02/11/2016  CLINICAL DATA:  RIGHT upper quadrant pain beginning yesterday. EXAM: US ABDOMEN LIMITED - RIGHT UPPER QUADRANT COMPARISON:  None. FINDINGS: Gallbladder: Multiple echogenic gallstones with acoustic shadowing measuring up to 2.2 cm. No gallbladder wall thickening or pericholecystic fluid. No sonographic Murphy's sign elicited. Common bile duct: Diameter: 4 mm Liver: Diffusely echogenic without biliary dilatation. Hepatopetal portal vein. IMPRESSION: Cholelithiasis without sonographic findings of acute cholecystitis. Echogenic liver compatible with hepatic steatosis. Electronically Signed   By: Elon Alas M.D.   On: 02/11/2016 04:21    Review of Systems  Constitutional: Negative for fever and chills.  Eyes: Negative for blurred vision.  Respiratory: Negative for cough and shortness of breath.   Cardiovascular: Negative for chest pain.  Gastrointestinal: Positive for nausea, vomiting, abdominal pain and diarrhea.  Genitourinary: Negative.   Musculoskeletal: Negative.   Skin: Negative.   Neurological: Negative.  Negative for headaches.  Endo/Heme/Allergies: Negative.   Psychiatric/Behavioral: Negative.     Blood pressure 127/86, pulse 59, temperature 98.2 F (36.8 C), temperature source Oral, resp. rate 16, last menstrual period 02/07/2016, SpO2 100 %. Physical Exam  Constitutional: She is oriented to person, place, and time. She appears well-developed and well-nourished. No distress.  HENT:  Head: Normocephalic and atraumatic.  Right Ear: External ear normal.  Left Ear: External ear normal.  Nose: Nose normal.   Mouth/Throat: Oropharynx is clear and moist.  Eyes: Conjunctivae and EOM are normal. Pupils are equal, round, and reactive to light. No scleral icterus.  Neck: Normal range of motion. Neck supple. No tracheal deviation present.  Cardiovascular: Normal rate, regular rhythm, normal heart sounds and intact distal pulses.   Respiratory: Effort normal and breath sounds normal. No stridor.  GI: She exhibits no distension and no mass. There is tenderness. There is no rebound and no guarding.  Significant tenderness  right upper quadrant without guarding, no other tenderness  Musculoskeletal: Normal range of motion.  Neurological: She is alert and oriented to person, place, and time. She exhibits normal muscle tone.  Skin: Skin is warm.  Psychiatric: She has a normal mood and affect.     Assessment/Plan Symptomatic cholelithiasis with transaminitis - admit, IV antibiotics, NPO. Plan laparoscopic cholecystectomy with cholangiogram this admission. Procedure, risks, and benefits were discussed with her. I will also review her case with Dr. Ninfa Linden later this morning. I answered her questions. Her history of hereditary thrombocytopenia is also noted but with platelet count of 72,000 should not be a significant problem.  Zenovia Jarred, MD 02/11/2016, 5:10 AM

## 2016-02-11 NOTE — Anesthesia Procedure Notes (Signed)
Procedure Name: Intubation Date/Time: 02/11/2016 2:47 PM Performed by: Lovie CholOCK, Paz Winsett K Pre-anesthesia Checklist: Patient identified, Emergency Drugs available, Suction available and Patient being monitored Patient Re-evaluated:Patient Re-evaluated prior to inductionOxygen Delivery Method: Circle System Utilized Preoxygenation: Pre-oxygenation with 100% oxygen Intubation Type: IV induction Ventilation: Mask ventilation without difficulty Laryngoscope Size: Mac and 3 Grade View: Grade II Tube type: Oral Tube size: 7.0 mm Number of attempts: 1 Airway Equipment and Method: Stylet and Oral airway Placement Confirmation: ETT inserted through vocal cords under direct vision,  positive ETCO2 and breath sounds checked- equal and bilateral Secured at: 20 cm Tube secured with: Tape Dental Injury: Teeth and Oropharynx as per pre-operative assessment

## 2016-02-11 NOTE — Transfer of Care (Signed)
Immediate Anesthesia Transfer of Care Note  Patient: [name] [name]  Procedure(s) Performed: Procedure(s): LAPAROSCOPIC CHOLECYSTECTOMY WITH INTRAOPERATIVE CHOLANGIOGRAM (N/A)  Patient Location: PACU  Anesthesia Type:General  Level of Consciousness: awake, oriented and patient cooperative  Airway & Oxygen Therapy: Patient Spontanous Breathing and Patient connected to face mask oxygen  Post-op Assessment: Report given to RN and Post -op Vital signs reviewed and stable  Post vital signs: Reviewed  Last Vitals:  Filed Vitals:   02/11/16 1014 02/11/16 1545  BP: 110/66 130/74  Pulse: 60 80  Temp: 37 C 36.7 C  Resp: 18 21    Last Pain:  Filed Vitals:   02/11/16 1549  PainSc: 5          Complications: No apparent anesthesia complications

## 2016-02-11 NOTE — ED Notes (Signed)
Dr. Thompson at bedside. 

## 2016-02-11 NOTE — Progress Notes (Signed)
Npo

## 2016-02-11 NOTE — ED Notes (Signed)
Pt. reports mid/upper abdominal pain with emesis and diarrhea onset this evening , denies fever or chills.

## 2016-02-11 NOTE — Anesthesia Preprocedure Evaluation (Addendum)
Anesthesia Evaluation  Patient identified by MRN, date of birth, ID band Patient awake    Reviewed: Allergy & Precautions, NPO status , Patient's Chart, lab work & pertinent test results  Airway Mallampati: II  TM Distance: >3 FB Neck ROM: Full    Dental no notable dental hx. (+) Teeth Intact, Dental Advisory Given, Chipped,    Pulmonary neg pulmonary ROS,    Pulmonary exam normal breath sounds clear to auscultation       Cardiovascular negative cardio ROS Normal cardiovascular exam Rhythm:Regular Rate:Normal     Neuro/Psych negative neurological ROS  negative psych ROS   GI/Hepatic negative GI ROS, Neg liver ROS,   Endo/Other  Morbid obesity  Renal/GU negative Renal ROS  negative genitourinary   Musculoskeletal negative musculoskeletal ROS (+)   Abdominal   Peds negative pediatric ROS (+)  Hematology Thrombocytopenia    Anesthesia Other Findings   Reproductive/Obstetrics negative OB ROS                           Anesthesia Physical Anesthesia Plan  ASA: II  Anesthesia Plan: General   Post-op Pain Management:    Induction: Intravenous  Airway Management Planned: Oral ETT  Additional Equipment:   Intra-op Plan:   Post-operative Plan: Extubation in OR  Informed Consent: I have reviewed the patients History and Physical, chart, labs and discussed the procedure including the risks, benefits and alternatives for the proposed anesthesia with the patient or authorized representative who has indicated his/her understanding and acceptance.   Dental advisory given  Plan Discussed with: CRNA and Surgeon  Anesthesia Plan Comments:         Anesthesia Quick Evaluation

## 2016-02-11 NOTE — ED Provider Notes (Signed)
CSN: [id number]     Arrival date & time 02/11/16  0215 History  By signing my name below, I, Bethel BornBritney McCollum, attest that this documentation has been prepared under the direction and in the presence of Loren Raceravid Linnet Bottari, MD. Electronically Signed: Bethel BornBritney McCollum, ED Scribe. 02/11/2016. 3:12 AM   Chief Complaint  Patient presents with  . Abdominal Pain  . Emesis  . Diarrhea   The history is provided by the patient. No language interpreter was used.   [name] [name] is a 30 y.o. female who presents to the Emergency Department complaining of new, waxing and waning, 8/10 in severity, right upper abdominal pain with onset this morning. She notes that the pain radiates to the back. The pain is not exacerbated by eating. Tums provided some pain relief earlier in the day but it woke her from sleeping approximately 3 hours ago. Associated symptoms include nausea, 4 episodes of emesis, and watery diarrhea for the last 3 hours. Pt denies fever, chills, dysuria, and increased frequency. She is currently menstruating but does not believe that she has had any hematuria. Her only abdominal surgery has been a tubal ligation.   Past Medical History  Diagnosis Date  . Anemia   . Obesity    Past Surgical History  Procedure Laterality Date  . Cerclage laparoscopic abdominal    . Cervical cerclage    . Arm surgery    . Tubal ligation     Family History  Problem Relation Age of Onset  . Diabetes Mother   . Hypertension Father    Social History  Substance Use Topics  . Smoking status: Never Smoker   . Smokeless tobacco: None  . Alcohol Use: 0.0 oz/week    0 Standard drinks or equivalent per week   OB History    Gravida Para Term Preterm AB TAB SAB Ectopic Multiple Living   3 1        1      Review of Systems  Constitutional: Negative for fever and chills.  Respiratory: Negative for cough and shortness of breath.   Cardiovascular: Negative for chest pain.  Gastrointestinal: Positive for nausea,  vomiting, abdominal pain and diarrhea. Negative for constipation and blood in stool.  Genitourinary: Negative for dysuria, frequency and hematuria.  Musculoskeletal: Positive for back pain. Negative for myalgias, neck pain and neck stiffness.  Skin: Negative for rash and wound.  Neurological: Negative for dizziness, weakness, light-headedness, numbness and headaches.  All other systems reviewed and are negative.  Allergies  Strawberry extract  Home Medications   Prior to Admission medications   Medication Sig Start Date End Date Taking? Authorizing Provider  albuterol (PROVENTIL HFA;VENTOLIN HFA) 108 (90 Base) MCG/ACT inhaler Inhale 1-2 puffs into the lungs every 6 (six) hours as needed for wheezing or shortness of breath.   Yes Historical Provider, MD  b complex-vitamin c-folic acid (NEPHRO-VITE) 0.8 MG TABS tablet Take 1 tablet by mouth daily.   Yes Historical Provider, MD  EPIPEN 2-PAK 0.3 MG/0.3ML SOAJ injection Inject 0.3 mg into the muscle daily as needed (allergic reaction).  08/28/14  Yes Historical Provider, MD   BP 159/84 mmHg  Pulse 74  Temp(Src) 98.2 F (36.8 C) (Oral)  Resp 16  SpO2 100%  LMP 02/07/2016 Physical Exam  Constitutional: She is oriented to person, place, and time. She appears well-developed and well-nourished. No distress.  HENT:  Head: Normocephalic and atraumatic.  Mouth/Throat: Oropharynx is clear and moist.  Eyes: EOM are normal. Pupils are equal, round, and  reactive to light.  Neck: Normal range of motion. Neck supple.  Cardiovascular: Normal rate and regular rhythm.   Pulmonary/Chest: Effort normal and breath sounds normal. No respiratory distress. She has no wheezes. She has no rales.  Abdominal: Soft. Bowel sounds are normal. She exhibits no distension and no mass. There is tenderness (they're tender to palpation the right upper quadrant and epigastric regions. Mild diffuse tenderness. No definite guarding). There is no rebound and no guarding.   Musculoskeletal: Normal range of motion. She exhibits tenderness. She exhibits no edema.  Tenderness to palpation in the right flank region. No lower extremity swelling or pain. Distal pulses are equal and intact.  Neurological: She is alert and oriented to person, place, and time.  Moves all extremities without deficit. Sensation is fully intact.  Skin: Skin is warm and dry. No rash noted. No erythema.  Psychiatric: She has a normal mood and affect. Her behavior is normal.  Nursing note and vitals reviewed.   ED Course  Procedures (including critical care time) DIAGNOSTIC STUDIES: Oxygen Saturation is 100% on RA,  normal by my interpretation.    COORDINATION OF CARE: 3:11 AM Discussed treatment plan which includes lab work, abdominal US, pain medication, antiemetic medication, and IVF with pt at bedside and pt agreed to plan.  Labs Review Labs Reviewed  COMPREHENSIVE METABOLIC PANEL - Abnormal; Notable for the following:    Glucose, Bld 138 (*)    AST 997 (*)    ALT 629 (*)    All other components within normal limits  CBC - Abnormal; Notable for the following:    Hemoglobin 11.0 (*)    HCT 34.5 (*)    MCV 77.9 (*)    MCH 24.8 (*)    Platelets 72 (*)    All other components within normal limits  URINALYSIS, ROUTINE W REFLEX MICROSCOPIC (NOT AT [facility]) - Abnormal; Notable for the following:    Color, Urine RED (*)    APPearance TURBID (*)    Hgb urine dipstick LARGE (*)    Bilirubin Urine SMALL (*)    Ketones, ur 15 (*)    Protein, ur 30 (*)    Leukocytes, UA SMALL (*)    All other components within normal limits  URINE MICROSCOPIC-ADD ON - Abnormal; Notable for the following:    Squamous Epithelial / LPF 0-5 (*)    Bacteria, UA FEW (*)    All other components within normal limits  LIPASE, BLOOD  POC URINE PREG, ED    Imaging Review US Abdomen Limited  02/11/2016  CLINICAL DATA:  RIGHT upper quadrant pain beginning yesterday. EXAM: US ABDOMEN LIMITED - RIGHT UPPER  QUADRANT COMPARISON:  None. FINDINGS: Gallbladder: Multiple echogenic gallstones with acoustic shadowing measuring up to 2.2 cm. No gallbladder wall thickening or pericholecystic fluid. No sonographic Murphy's sign elicited. Common bile duct: Diameter: 4 mm Liver: Diffusely echogenic without biliary dilatation. Hepatopetal portal vein. IMPRESSION: Cholelithiasis without sonographic findings of acute cholecystitis. Echogenic liver compatible with hepatic steatosis. Electronically Signed   By: Awilda Metro M.D.   On: 02/11/2016 04:21   I have personally reviewed and evaluated these images and lab results as part of my medical decision-making.   EKG Interpretation None      MDM   Final diagnoses:  Calculus of gallbladder without cholecystitis without obstruction   I personally performed the services described in this documentation, which was scribed in my presence. The recorded information has been reviewed and is accurate.   Elevated LFTs  and cholelithiasis on ultrasound. Discussed with Dr. Janee Morn. Will see patient in emergency department.   Loren Racer, MD 02/11/16 [phone]

## 2016-02-11 NOTE — Progress Notes (Signed)
Offered the Pt. A bath, pt. Stated she most likely will be taking a bath herself later on today. I inform the Pt. She has all supplies needed for the bath on the shelf but if she need it anything else just to let me know. I also inform while she washes herself we can change the linens

## 2016-02-11 NOTE — Op Note (Signed)
Laparoscopic Cholecystectomy with IOC Procedure Note  Indications: This patient presents with symptomatic gallbladder disease and will undergo laparoscopic cholecystectomy.  Pre-operative Diagnosis: acute cholecystitis with cholelithiasis  Post-operative Diagnosis: Same  Surgeon: Abigail MiyamotoBLACKMAN,Halimah Bewick A   Assistants: 0  Anesthesia: General endotracheal anesthesia  ASA Class: 2  Procedure Details  The patient was seen again in the Holding Room. The risks, benefits, complications, treatment options, and expected outcomes were discussed with the patient. The possibilities of reaction to medication, pulmonary aspiration, perforation of viscus, bleeding, recurrent infection, finding a normal gallbladder, the need for additional procedures, failure to diagnose a condition, the possible need to convert to an open procedure, and creating a complication requiring transfusion or operation were discussed with the patient. The likelihood of improving the patient's symptoms with return to their baseline status is good.  The patient and/or family concurred with the proposed plan, giving informed consent. The site of surgery properly noted. The patient was taken to Operating Room, identified as [name] [name] and the procedure verified as Laparoscopic Cholecystectomy with Intraoperative Cholangiogram. A Time Out was held and the above information confirmed.  Prior to the induction of general anesthesia, antibiotic prophylaxis was administered. General endotracheal anesthesia was then administered and tolerated well. After the induction, the abdomen was prepped with Chloraprep and draped in the sterile fashion. The patient was positioned in the supine position.  Local anesthetic agent was injected into the skin near the umbilicus and an incision made. We dissected down to the abdominal fascia with blunt dissection.  The fascia was incised vertically and we entered the peritoneal cavity bluntly.  A pursestring suture  of 0-Vicryl was placed around the fascial opening.  The Hasson cannula was inserted and secured with the stay suture.  Pneumoperitoneum was then created with CO2 and tolerated well without any adverse changes in the patient's vital signs. A 5-mm port was placed in the subxiphoid position.  Two 5-mm ports were placed in the right upper quadrant. All skin incisions were infiltrated with a local anesthetic agent before making the incision and placing the trocars.   We positioned the patient in reverse Trendelenburg, tilted slightly to the patient's left.  The gallbladder was identified, the fundus grasped and retracted cephalad. Adhesions were lysed bluntly and with the electrocautery where indicated, taking care not to injure any adjacent organs or viscus. The infundibulum was grasped and retracted laterally, exposing the peritoneum overlying the triangle of Calot. This was then divided and exposed in a blunt fashion. A critical view of the cystic duct and cystic artery was obtained.  The cystic duct was clearly identified and bluntly dissected circumferentially. The cystic duct was ligated with a clip distally.   An incision was made in the cystic duct and the [facility] introduced. The catheter was secured using a clip. A cholangiogram was then obtained which showed good visualization of the distal and proximal biliary tree with no sign of filling defects or obstruction.  Contrast flowed easily into the duodenum. The catheter was then removed.   The cystic duct was then ligated with clips and divided. The cystic artery was identified, dissected free, ligated with clips and divided as well.   The gallbladder was dissected from the liver bed in retrograde fashion with the electrocautery. The gallbladder was removed and placed in an Endocatch sac. The liver bed was irrigated and inspected. Hemostasis was achieved with the electrocautery. Copious irrigation was utilized and was repeatedly  aspirated until clear.  The gallbladder and Endocatch sac  were then removed through the umbilical port site.  The pursestring suture was used to close the umbilical fascia.    We again inspected the right upper quadrant for hemostasis.  Pneumoperitoneum was released as we removed the trocars.  4-0 Monocryl was used to close the skin.   Skin glue was then applied. The patient was then extubated and brought to the recovery room in stable condition. Instrument, sponge, and needle counts were correct at closure and at the conclusion of the case.   Findings: Cholecystitis with Cholelithiasis  Estimated Blood Loss: Minimal         Drains: 0         Specimens: Gallbladder           Complications: None; patient tolerated the procedure well.         Disposition: PACU - hemodynamically stable.         Condition: stable

## 2016-02-11 NOTE — Progress Notes (Signed)
Patient ID: [name], female   DOB: Nov 29, 1985, 30 y.o.   MRN: [id number]   Patient with symptomatic cholelithiasis Lap chole with IOC is recommended I discussed the procedure in detail.  The patient was given [employer] engineered[employer]ational material.  We discussed the risks and benefits of a laparoscopic cholecystectomy and possible cholangiogram including, but not limited to bleeding, infection, injury to surrounding structures such as the intestine or liver, bile leak, retained gallstones, need to convert to an open procedure, prolonged diarrhea, blood clots such as  DVT, common bile duct injury, anesthesia risks, and possible need for additional procedures.  The likelihood of improvement in symptoms and return to the patient's normal status is good. We discussed the typical post-operative recovery course.

## 2016-02-12 LAB — COMPREHENSIVE METABOLIC PANEL
ALBUMIN: 3.6 g/dL (ref 3.5–5.0)
ALT: 469 U/L — AB (ref 14–54)
AST: 274 U/L — AB (ref 15–41)
Alkaline Phosphatase: 102 U/L (ref 38–126)
Anion gap: 8 (ref 5–15)
CHLORIDE: 100 mmol/L — AB (ref 101–111)
CO2: 26 mmol/L (ref 22–32)
CREATININE: 0.78 mg/dL (ref 0.44–1.00)
Calcium: 8.9 mg/dL (ref 8.9–10.3)
GFR calc Af Amer: >60 mL/min (ref >60)
GLUCOSE: 138 mg/dL — AB (ref 65–99)
POTASSIUM: 4.3 mmol/L (ref 3.5–5.1)
SODIUM: 134 mmol/L — AB (ref 135–145)
Total Bilirubin: 0.5 mg/dL (ref 0.3–1.2)
Total Protein: 6.9 g/dL (ref 6.5–8.1)

## 2016-02-12 MED ORDER — PANTOPRAZOLE SODIUM 40 MG PO TBEC: 40.0000 mg | DELAYED_RELEASE_TABLET | Freq: Every day | ORAL | Status: DC

## 2016-02-12 MED ORDER — OXYCODONE-ACETAMINOPHEN 5-325 MG PO TABS: 1.0000 | ORAL_TABLET | ORAL | Status: DC | PRN

## 2016-02-12 MED ORDER — ONDANSETRON HCL 4 MG PO TABS
4.0000 mg | ORAL_TABLET | Freq: Three times a day (TID) | ORAL | Status: DC | PRN
Start: 2016-02-12 — End: 2016-08-24

## 2016-02-12 NOTE — Progress Notes (Addendum)
1 Day Post-Op  Subjective: Quite sore.  A little nausea.  Some dizziness when getting up last night.  Objective: Vital signs in last 24 hours: Temp:  [98.1 F (36.7 C)-98.9 F (37.2 C)] 98.7 F (37.1 C) (05/27 0500) Pulse Rate:  [59-80] 65 (05/27 0500) Resp:  [17-22] 18 (05/27 0500) BP: (110-135)/(66-88) 135/88 mmHg (05/27 0500) SpO2:  [92 %-100 %] 99 % (05/27 0500) Last BM Date: 02/10/16  Intake/Output from previous day: 05/26 0701 - 05/27 0700 In: 1574.6 [P.O.:120; I.V.:1404.6; IV Piggyback:50] Out: 537 [Urine:527; Blood:10] Intake/Output this shift:    PE: General- In NAD Abdomen-soft, tender around incisions, dressings dry  Lab Results:   Recent Labs  02/11/16 0232  WBC 4.7  HGB 11.0*  HCT 34.5*  PLT 72*   BMET  Recent Labs  02/11/16 0232 02/12/16 0500  NA 135 134*  K 3.7 4.3  CL 103 100*  CO2 24 26  GLUCOSE 138* 138*  BUN 9 <5*  CREATININE 0.78 0.78  CALCIUM 9.0 8.9   PT/INR No results for input(s): LABPROT, INR in the last 72 hours. Comprehensive Metabolic Panel:    Component Value Date/Time   NA 134* 02/12/2016 0500   NA 135 02/11/2016 0232   NA 140 09/14/2014 1407   K 4.3 02/12/2016 0500   K 3.7 02/11/2016 0232   K 3.5 09/14/2014 1407   CL 100* 02/12/2016 0500   CL 103 02/11/2016 0232   CO2 26 02/12/2016 0500   CO2 24 02/11/2016 0232   CO2 23 09/14/2014 1407   BUN <5* 02/12/2016 0500   BUN 9 02/11/2016 0232   BUN 9.0 09/14/2014 1407   CREATININE 0.78 02/12/2016 0500   CREATININE 0.78 02/11/2016 0232   CREATININE 0.8 09/14/2014 1407   GLUCOSE 138* 02/12/2016 0500   GLUCOSE 138* 02/11/2016 0232   GLUCOSE 122 09/14/2014 1407   CALCIUM 8.9 02/12/2016 0500   CALCIUM 9.0 02/11/2016 0232   CALCIUM 8.6 09/14/2014 1407   AST 274* 02/12/2016 0500   AST 997* 02/11/2016 0232   AST 17 09/14/2014 1407   ALT 469* 02/12/2016 0500   ALT 629* 02/11/2016 0232   ALT 20 09/14/2014 1407   ALKPHOS 102 02/12/2016 0500   ALKPHOS 100 02/11/2016  0232   ALKPHOS 54 09/14/2014 1407   BILITOT 0.5 02/12/2016 0500   BILITOT 0.9 02/11/2016 0232   BILITOT 0.25 09/14/2014 1407   PROT 6.9 02/12/2016 0500   PROT 7.1 02/11/2016 0232   PROT 6.9 09/14/2014 1407   ALBUMIN 3.6 02/12/2016 0500   ALBUMIN 4.0 02/11/2016 0232   ALBUMIN 3.7 09/14/2014 1407     Studies/Results: Dg Cholangiogram Operative  02/11/2016  CLINICAL DATA:  Cholelithiasis. EXAM: INTRAOPERATIVE CHOLANGIOGRAM TECHNIQUE: Cholangiographic images from the C-arm fluoroscopic device were submitted for interpretation post-operatively. Please see the procedural report for the amount of contrast and the fluoroscopy time utilized. COMPARISON:  Ultrasound 02/11/2016 FINDINGS: Contrast opacification of the intrahepatic and extrahepatic biliary system. Contrast drains into the duodenum. There are round filling defects in the common bile duct which probably represent gas bubbles. No significant dilatation of the biliary system. IMPRESSION: Patent biliary system. Mobile filling defects probably represent gas bubbles. Nonobstructive stones cannot be completely excluded. Electronically Signed   By: Richarda Overlie M.D.   On: 02/11/2016 15:35   US Abdomen Limited  02/11/2016  CLINICAL DATA:  RIGHT upper quadrant pain beginning yesterday. EXAM: US ABDOMEN LIMITED - RIGHT UPPER QUADRANT COMPARISON:  None. FINDINGS: Gallbladder: Multiple echogenic gallstones with acoustic shadowing measuring  up to 2.2 cm. No gallbladder wall thickening or pericholecystic fluid. No sonographic Murphy's sign elicited. Common bile duct: Diameter: 4 mm Liver: Diffusely echogenic without biliary dilatation. Hepatopetal portal vein. IMPRESSION: Cholelithiasis without sonographic findings of acute cholecystitis. Echogenic liver compatible with hepatic steatosis. Electronically Signed   By: Awilda Metroourtnay  Bloomer M.D.   On: 02/11/2016 04:21    Anti-infectives: Anti-infectives    Start     Dose/Rate Route Frequency Ordered Stop    02/11/16 1515  ceFAZolin (ANCEF) IVPB 2 g/50 mL premix     2 g 100 mL/hr over 30 Minutes Intravenous  Once 02/11/16 1507 02/11/16 1455   02/11/16 0700  cefTRIAXone (ROCEPHIN) 2 g in dextrose 5 % 50 mL IVPB     2 g 100 mL/hr over 30 Minutes Intravenous Every 24 hours 02/11/16 0634     02/11/16 0600  cefTRIAXone (ROCEPHIN) 2 g in dextrose 5 % 50 mL IVPB  Status:  Discontinued     2 g 100 mL/hr over 30 Minutes Intravenous Every 24 hours 02/11/16 0550 02/11/16 0634      Assessment Active Problems:   Acute cholecystitis with cholelithiasis-still quite sore; some nausea.  Discussed IOC findings with her and radiologist's impression that the findings were suggestive of air bubbles; transaminases trending down      Plan: Full liquids.  Home later today if feeling better.  Discharge instructions given to her.   [name] [name] [name] [name] 02/12/2016

## 2016-02-12 NOTE — Progress Notes (Signed)
Pt states she is ready to go home.  Family at bedside.  Will review all DC instructions and rx.

## 2016-02-12 NOTE — Discharge Instructions (Signed)
LAPAROSCOPIC SURGERY: POST OP INSTRUCTIONS  1. DIET: Follow a light bland diet the first 24 hours after arrival home, such as soup, liquids, crackers, etc.  Be sure to include lots of fluids daily.  Avoid fast food or heavy meals as your are more likely to get nauseated.  Eat a low fat the next few days after surgery.   2. Take your usually prescribed home medications unless otherwise directed. 3. PAIN CONTROL: a. Pain is best controlled by a usual combination of three different methods TOGETHER: i. Ice/Heat ii. Over the counter pain medication iii. Prescription pain medication b. Most patients will experience some swelling and bruising around the incisions.  Ice packs or heating pads (30-60 minutes up to 6 times a day) will help. Use ice for the first few days to help decrease swelling and bruising, then switch to heat to help relax tight/sore spots and speed recovery.  Some people prefer to use ice alone, heat alone, alternating between ice & heat.  Experiment to what works for you.  Swelling and bruising can take several weeks to resolve.   c. It is helpful to take an over-the-counter pain medication regularly for the first few weeks.  Choose one of the following that works best for you: i. Naproxen (Aleve, etc)  Two 220mg  tabs twice a day ii. Ibuprofen (Advil, etc) Three 200mg  tabs four times a day (every meal & bedtime) iii. Acetaminophen (Tylenol, etc) 500-650mg  four times a day (every meal & bedtime) d. A  prescription for pain medication (such as oxycodone, hydrocodone, etc) should be given to you upon discharge.  Take your pain medication as prescribed.  i. If you are having problems/concerns with the prescription medicine (does not control pain, nausea, vomiting, rash, itching, etc), please call us [phone] to see if we need to switch you to a different pain medicine that will work better for you and/or control your side effect better. ii. If you need a refill on your pain  medication, please contact your pharmacy.  They will contact our office to request authorization. Prescriptions will not be filled after 5 pm or on week-ends. 4. Avoid getting constipated.  Between the surgery and the pain medications, it is common to experience some constipation.  Increasing fluid intake and taking a fiber supplement (such as Metamucil, Citrucel, FiberCon, MiraLax, etc) 1-2 times a day regularly will usually help prevent this problem from occurring.  A mild laxative (prune juice, Milk of Magnesia, MiraLax, etc) should be taken according to package directions if there are no bowel movements after 48 hours.   5. Watch out for diarrhea.  If you have many loose bowel movements, simplify your diet to bland foods & liquids for a few days.  Stop any stool softeners and decrease your fiber supplement.  Switching to mild anti-diarrheal medications (Kayopectate, Pepto Bismol) can help.  If this worsens or does not improve, please call us. 6. Wash / shower every day.  You may shower over the dressings as they are waterproof.  Continue to shower over incision(s) after the dressing is off. 7. Remove your waterproof bandages 5 days after surgery.  You may leave the incision open to air.  You may replace a dressing/Band-Aid to cover the incision for comfort if you wish.  8. ACTIVITIES as tolerated:   a. You may resume regular (light) daily activities beginning the next day--such as daily self-care, walking, climbing stairs--gradually increasing light activities as tolerated.  No heavy lifting (over 10 pounds), straining, or  intense activities for 2 weeks. °b. DO NOT PUSH THROUGH PAIN.  Let pain be your guide: If it hurts to do something, don't do it.  Pain is your body warning you to avoid that activity for another week until the pain goes down. °c. You may drive when you are no longer taking prescription pain medication, you can comfortably wear a seatbelt, and you can safely maneuver your car and apply  brakes. °d. You may have sexual intercourse when it is comfortable.  °9. FOLLOW UP in our office °a. Please call [facility] at [phone] to set up an appointment to see your surgeon in the office for a follow-up appointment approximately 2-3 weeks after your surgery. °b. Make sure that you call for this appointment the day you arrive home to insure a convenient appointment time. °10. IF YOU HAVE DISABILITY OR FAMILY LEAVE FORMS, BRING THEM TO THE OFFICE FOR PROCESSING.  DO NOT GIVE THEM TO YOUR DOCTOR. ° °11.  Return to work/school:  Desk work/light activities in 5-7 days, full duty/activities in 2 weeks if pain-free. ° ° °WHEN TO CALL US [phone]: °1. Poor pain control °2. Reactions / problems with new medications (rash/itching, nausea, etc)  °3. Fever over 101.5 F (38.5 C) °4. Inability to urinate °5. Nausea and/or vomiting °6. Worsening swelling or bruising °7. Continued bleeding from incision. °8. Increased pain, redness, or drainage from the incision ° ° The clinic staff is available to answer your questions during regular business hours (8:30am-5pm).  Please don’t hesitate to call and ask to speak to one of our nurses for clinical concerns.  ° If you have a medical emergency, go to the nearest emergency room or call 911. ° A surgeon from [facility] is always on call at the hospitals ° ° °[facility] °[address], [address], [place], [place]  [address] ? °MAIN: [phone] ? TOLL FREE: [phone] ?  °FAX [phone] °[email or web address] ° °

## 2016-02-14 ENCOUNTER — Encounter (HOSPITAL_COMMUNITY): Payer: Self-pay | Admitting: Surgery

## 2016-02-17 NOTE — Discharge Summary (Signed)
Physician Discharge Summary  Patient ID: [name] MelnickKeira M [name] MRN: [id number] DOB/AGE: 18-May-1986 30 y.o.  Admit date: 02/11/2016 Discharge date: 02/12/2016  Admission Diagnoses:  Acute cholecystitis  Discharge Diagnoses:  Active Problems:   Acute calculous cholecystitis s/p lap chole with IOC (Dr. Carman Chingoug Blackman)   Discharged Condition: good  Hospital Course: She was admitted, started on IV abxs and underwent the above procedure which she tolerated well.  Her IOC showed what were felt to be air bubbles in the CBD instead of stones and this was discussed with her.  Her LFTs were trending down.  She was discharged on POD#1.  Instructions were given to her.  If she has recurrent biliary colic type symptoms, she will need a work up for possible CBD stones and this was explained to her.  Discharge Exam: Blood pressure 121/67, pulse 61, temperature 98.1 F (36.7 C), temperature source Oral, resp. rate 18, last menstrual period 02/07/2016, SpO2 98 %.   Disposition: 01-Home or Self Care     Medication List    TAKE these medications        albuterol 108 (90 Base) MCG/ACT inhaler  Commonly known as:  PROVENTIL HFA;VENTOLIN HFA  Inhale 1-2 puffs into the lungs every 6 (six) hours as needed for wheezing or shortness of breath.     b complex-vitamin c-folic acid 0.8 MG Tabs tablet  Take 1 tablet by mouth daily.     EPIPEN 2-PAK 0.3 mg/0.3 mL Soaj injection  Generic drug:  EPINEPHrine  Inject 0.3 mg into the muscle daily as needed (allergic reaction).     ondansetron 4 MG tablet  Commonly known as:  ZOFRAN  Take 1 tablet (4 mg total) by mouth every 8 (eight) hours as needed for nausea.     oxyCODONE-acetaminophen 5-325 MG tablet  Commonly known as:  PERCOCET/ROXICET  Take 1-2 tablets by mouth every 4 (four) hours as needed for moderate pain.           Follow-up Information    Follow up with [facility] On 03/01/2016.   Specialty:  General Surgery   Why:  arrive by  10:45AM for a 11:15AM post operative check up    Contact information:   [address] [place] [address] [phone]       Signed: Adolph PollackROSENBOWER,[name] 02/17/2016, 9:00 AM

## 2016-02-23 ENCOUNTER — Other Ambulatory Visit (INDEPENDENT_AMBULATORY_CARE_PROVIDER_SITE_OTHER): Payer: Self-pay | Admitting: Physician Assistant

## 2016-02-23 DIAGNOSIS — K8000 Calculus of gallbladder with acute cholecystitis without obstruction: Secondary | ICD-10-CM

## 2016-02-24 ENCOUNTER — Ambulatory Visit
Admission: RE | Admit: 2016-02-24 | Discharge: 2016-02-24 | Disposition: A | Discharge status: Home / Self Care | Payer: Medicaid Other | Source: Ambulatory Visit | Attending: Physician Assistant | Admitting: Physician Assistant

## 2016-02-24 DIAGNOSIS — K8000 Calculus of gallbladder with acute cholecystitis without obstruction: Secondary | ICD-10-CM

## 2016-03-15 ENCOUNTER — Ambulatory Visit
Admission: RE | Admit: 2016-03-15 | Discharge: 2016-03-15 | Disposition: A | Discharge status: Home / Self Care | Payer: Medicaid Other | Source: Ambulatory Visit | Attending: Physician Assistant | Admitting: Physician Assistant

## 2016-03-15 ENCOUNTER — Other Ambulatory Visit (INDEPENDENT_AMBULATORY_CARE_PROVIDER_SITE_OTHER): Payer: Self-pay | Admitting: Physician Assistant

## 2016-03-15 DIAGNOSIS — R1031 Right lower quadrant pain: Secondary | ICD-10-CM

## 2016-03-15 MED ORDER — IOPAMIDOL (ISOVUE-300) INJECTION 61%
95.0000 mL | Freq: Once | INTRAVENOUS | Status: AC | PRN
  Administered 2016-03-15: 95 mL via INTRAVENOUS

## 2016-03-26 ENCOUNTER — Encounter (HOSPITAL_COMMUNITY): Payer: Self-pay | Admitting: Emergency Medicine

## 2016-03-26 ENCOUNTER — Emergency Department (HOSPITAL_COMMUNITY)
Admission: EM | Admit: 2016-03-26 | Discharge: 2016-03-27 | Disposition: A | Discharge status: Home / Self Care | Payer: Medicaid Other | Attending: Emergency Medicine | Admitting: Emergency Medicine

## 2016-03-26 ENCOUNTER — Emergency Department (HOSPITAL_COMMUNITY): Payer: Medicaid Other

## 2016-03-26 DIAGNOSIS — Y999 Unspecified external cause status: Secondary | ICD-10-CM | POA: Insufficient documentation

## 2016-03-26 DIAGNOSIS — Z79899 Other long term (current) drug therapy: Secondary | ICD-10-CM | POA: Diagnosis not present

## 2016-03-26 DIAGNOSIS — Y9369 Activity, other involving other sports and athletics played as a team or group: Secondary | ICD-10-CM | POA: Diagnosis not present

## 2016-03-26 DIAGNOSIS — W010XXA Fall on same level from slipping, tripping and stumbling without subsequent striking against object, initial encounter: Secondary | ICD-10-CM | POA: Insufficient documentation

## 2016-03-26 DIAGNOSIS — Y929 Unspecified place or not applicable: Secondary | ICD-10-CM | POA: Insufficient documentation

## 2016-03-26 DIAGNOSIS — W19XXXA Unspecified fall, initial encounter: Secondary | ICD-10-CM

## 2016-03-26 DIAGNOSIS — S8262XA Displaced fracture of lateral malleolus of left fibula, initial encounter for closed fracture: Secondary | ICD-10-CM | POA: Diagnosis not present

## 2016-03-26 DIAGNOSIS — S99912A Unspecified injury of left ankle, initial encounter: Secondary | ICD-10-CM | POA: Diagnosis present

## 2016-03-26 DIAGNOSIS — S82892A Other fracture of left lower leg, initial encounter for closed fracture: Secondary | ICD-10-CM

## 2016-03-26 LAB — BASIC METABOLIC PANEL
Anion gap: 8 (ref 5–15)
BUN: 11 mg/dL (ref 6–20)
CHLORIDE: 106 mmol/L (ref 101–111)
CO2: 21 mmol/L — ABNORMAL LOW (ref 22–32)
Calcium: 9.1 mg/dL (ref 8.9–10.3)
Creatinine, Ser: 0.78 mg/dL (ref 0.44–1.00)
GFR calc Af Amer: >60 mL/min (ref >60)
GFR calc non Af Amer: >60 mL/min (ref >60)
GLUCOSE: 138 mg/dL — AB (ref 65–99)
POTASSIUM: 3.5 mmol/L (ref 3.5–5.1)
SODIUM: 135 mmol/L (ref 135–145)

## 2016-03-26 LAB — CBC
HCT: 35.4 % — ABNORMAL LOW (ref 36.0–46.0)
HEMOGLOBIN: 11.1 g/dL — AB (ref 12.0–15.0)
MCH: 24.5 pg — AB (ref 26.0–34.0)
MCHC: 31.4 g/dL (ref 30.0–36.0)
MCV: 78.1 fL (ref 78.0–100.0)
Platelets: 73 10*3/uL — ABNORMAL LOW (ref 150–400)
RBC: 4.53 MIL/uL (ref 3.87–5.11)
RDW: 15.6 % — ABNORMAL HIGH (ref 11.5–15.5)
WBC: 8.1 10*3/uL (ref 4.0–10.5)

## 2016-03-26 MED ORDER — PROPOFOL 10 MG/ML IV BOLUS
INTRAVENOUS | Status: AC | PRN
  Administered 2016-03-26: 10 mg via INTRAVENOUS
  Administered 2016-03-26: 15 mg via INTRAVENOUS
  Administered 2016-03-26: 30 mg via INTRAVENOUS
  Administered 2016-03-26: 10 mg via INTRAVENOUS
  Administered 2016-03-26: 25 ug via INTRAVENOUS

## 2016-03-26 MED ORDER — OXYCODONE-ACETAMINOPHEN 5-325 MG PO TABS
2.0000 | ORAL_TABLET | Freq: Once | ORAL | Status: AC
Start: 2016-03-26 — End: 2016-03-27
  Administered 2016-03-27: 2 via ORAL
  Filled 2016-03-26: qty 2

## 2016-03-26 MED ORDER — SODIUM CHLORIDE 0.9 % IV BOLUS (SEPSIS)
1000.0000 mL | Freq: Once | INTRAVENOUS | Status: AC
  Administered 2016-03-26: 1000 mL via INTRAVENOUS

## 2016-03-26 MED ORDER — KETAMINE HCL 10 MG/ML IJ SOLN
INTRAMUSCULAR | Status: AC | PRN
  Administered 2016-03-26: 50 mg via INTRAVENOUS

## 2016-03-26 MED ORDER — HYDROMORPHONE HCL 1 MG/ML IJ SOLN
1.0000 mg | Freq: Once | INTRAMUSCULAR | Status: AC
  Administered 2016-03-26: 1 mg via INTRAVENOUS
  Filled 2016-03-26: qty 1

## 2016-03-26 MED ORDER — PROPOFOL 10 MG/ML IV BOLUS
1.0000 mg/kg | Freq: Once | INTRAVENOUS | Status: DC
  Filled 2016-03-26: qty 20

## 2016-03-26 MED ORDER — KETAMINE HCL-SODIUM CHLORIDE 100-0.9 MG/10ML-% IV SOSY
56.0000 mg | PREFILLED_SYRINGE | Freq: Once | INTRAVENOUS | Status: DC
  Filled 2016-03-26: qty 10

## 2016-03-26 MED ORDER — SODIUM CHLORIDE 0.9 % IV SOLN
INTRAVENOUS | Status: AC | PRN
  Administered 2016-03-26: 1000 mL via INTRAVENOUS

## 2016-03-26 NOTE — ED Provider Notes (Signed)
See attestation note on resident H&P.   Patient was playing kickball this evening and slipped, catching her foot and sustaining a left ankle injury. Obvious deformity on exam without any lacerations to suggest open fracture. 2+ DP pulses. I reviewed the plain films which show displaced fibular fracture, displaced posterior malleolus fracture and widening of the mortise suggestive of ligamentous disruption. Discussed with Dr. Charlann Boxerlin, and per his instructions reduced fx for better alignment and placed in cadillac splint. Performed procedural sedation. See procedure note for details. Patient will follow-up in 2-3 days in the clinic for discussion of surgical management. Instructed to be nonweightbearing and provided with crutches as well as Percocet for pain control. Reviewed return precautions including any signs of neurovascular compromise.  Laurence Spatesachel Morgan Yeng Frankie, MD 03/26/16 [phone]

## 2016-03-26 NOTE — ED Provider Notes (Signed)
CSN: [id number]     Arrival date & time 03/26/16  2030 History   First MD Initiated Contact with Patient 03/26/16 2041     Chief Complaint  Patient presents with  . Ankle Injury     (Consider location/radiation/quality/duration/timing/severity/associated sxs/prior Treatment) Patient is a 30 y.o. female presenting with ankle pain. The history is provided by the patient.  Ankle Pain Location:  Ankle Time since incident:  1 hour Injury: yes   Mechanism of injury comment:  Sliding into a base in kickball Ankle location:  L ankle Pain details:    Quality:  Aching   Radiates to:  Does not radiate   Severity:  Severe   Onset quality:  Sudden   Timing:  Constant   Progression:  Unchanged Chronicity:  New Dislocation: yes   Associated symptoms: no back pain, no fever and no neck pain     Past Medical History  Diagnosis Date  . Anemia   . Obesity    Past Surgical History  Procedure Laterality Date  . Cerclage laparoscopic abdominal    . Cervical cerclage    . Arm surgery    . Tubal ligation    . Cholecystectomy N/A 02/11/2016    Procedure: LAPAROSCOPIC CHOLECYSTECTOMY WITH INTRAOPERATIVE CHOLANGIOGRAM;  Surgeon: Abigail Miyamoto, MD;  Location: [facility] OR;  Service: General;  Laterality: N/A;   Family History  Problem Relation Age of Onset  . Diabetes Mother   . Hypertension Father    Social History  Substance Use Topics  . Smoking status: Never Smoker   . Smokeless tobacco: None  . Alcohol Use: 0.0 oz/week    0 Standard drinks or equivalent per week   OB History    Gravida Para Term Preterm AB TAB SAB Ectopic Multiple Living   3 1        1      Review of Systems  Constitutional: Negative for fever and chills.  HENT: Negative for congestion and sore throat.   Eyes: Negative for pain.  Respiratory: Negative for cough and shortness of breath.   Cardiovascular: Negative for chest pain and palpitations.  Gastrointestinal: Negative for nausea, vomiting, abdominal pain and  diarrhea.  Genitourinary: Negative for dysuria and flank pain.  Musculoskeletal: Negative for back pain and neck pain.       Left ankle pain  Skin: Negative for rash.  Allergic/Immunologic: Negative.   Neurological: Negative for dizziness and light-headedness.  Psychiatric/Behavioral: Negative for confusion.      Allergies  Strawberry extract  Home Medications   Prior to Admission medications   Medication Sig Start Date End Date Taking? Authorizing Provider  albuterol (PROVENTIL HFA;VENTOLIN HFA) 108 (90 Base) MCG/ACT inhaler Inhale 1-2 puffs into the lungs every 6 (six) hours as needed for wheezing or shortness of breath.   Yes Historical Provider, MD  cetirizine (ZYRTEC) 10 MG tablet Take 10 mg by mouth daily as needed for allergies.   Yes Historical Provider, MD  ondansetron (ZOFRAN) 4 MG tablet Take 1 tablet (4 mg total) by mouth every 8 (eight) hours as needed for nausea. 02/12/16  Yes Avel Peace, MD  oxyCODONE-acetaminophen (PERCOCET/ROXICET) 5-325 MG tablet Take 1-2 tablets by mouth every 4 (four) hours as needed for severe pain. 03/27/16   Tery Sanfilippo, MD   BP 114/71 mmHg  Pulse 81  Temp(Src) 98.6 F (37 C) (Oral)  Resp 15  SpO2 100% Physical Exam  Constitutional: She is oriented to person, place, and time. She appears well-developed and well-nourished. No distress.  HENT:  Head: Normocephalic and atraumatic.  Eyes: Conjunctivae and EOM are normal. Pupils are equal, round, and reactive to light.  Neck: Normal range of motion. Neck supple.  Cardiovascular: Normal rate, regular rhythm and normal heart sounds.   Pulmonary/Chest: Effort normal and breath sounds normal. No respiratory distress.  Abdominal: Soft. Bowel sounds are normal. There is no tenderness.  Musculoskeletal:       Left knee: Normal.       Left ankle: She exhibits decreased range of motion, swelling, ecchymosis and deformity. She exhibits no laceration and normal pulse. Tenderness. Medial  malleolus tenderness found. No lateral malleolus tenderness found. Achilles tendon normal.       Left lower leg: Normal.       Left foot: Normal.  Neurological: She is alert and oriented to person, place, and time. She has normal reflexes. No cranial nerve deficit.  Skin: Skin is warm and dry. She is not diaphoretic.  Psychiatric: She has a normal mood and affect.    ED Course  Reduction of fracture Date/Time: 03/27/2016 1:09 PM Performed by: Tery SanfilippoIESTER, Anahita Cua Authorized by: Laurence SpatesLITTLE, RACHEL MORGAN Consent: Verbal consent obtained. Written consent obtained. Risks and benefits: risks, benefits and alternatives were discussed Consent given by: patient Required items: required blood products, implants, devices, and special equipment available Patient identity confirmed: verbally with patient Time out: Immediately prior to procedure a "time out" was called to verify the correct patient, procedure, equipment, support staff and site/side marked as required. Local anesthesia used: no Patient sedated: yes Sedatives: propofol and ketamine Vitals: Vital signs were monitored during sedation. Patient tolerance: Patient tolerated the procedure well with no immediate complications Comments: Left ankle fx reduced and splinted.   Marland Kitchen.Splint Application Date/Time: 03/27/2016 1:10 PM Performed by: Tery SanfilippoIESTER, Odessie Polzin Authorized by: Laurence SpatesLITTLE, RACHEL MORGAN Consent: Verbal consent obtained. Risks and benefits: risks, benefits and alternatives were discussed Consent given by: patient Time out: Immediately prior to procedure a "time out" was called to verify the correct patient, procedure, equipment, support staff and site/side marked as required. Location details: left ankle Splint type: Ankle stirrup and posterior slab. Post-procedure: The splinted body part was neurovascularly unchanged following the procedure. Patient tolerance: Patient tolerated the procedure well with no immediate complications    (including critical care time) Procedural sedation Performed by: Tery SanfilippoMatthew Halston Kintz Consent: Verbal consent obtained. Risks and benefits: risks, benefits and alternatives were discussed Required items: required blood products, implants, devices, and special equipment available Patient identity confirmed: arm band and provided demographic data Time out: Immediately prior to procedure a "time out" was called to verify the correct patient, procedure, equipment, support staff and site/side marked as required.  Sedation type: moderate (conscious) sedation NPO time confirmed and considedered  Sedatives: KETAMINE, propofol  Physician Time at Bedside: 25 min  Vitals: Vital signs were monitored during sedation. Cardiac Monitor, pulse oximeter Patient tolerance: Patient tolerated the procedure well with no immediate complications. Comments: Pt with uneventful recovered. Returned to pre-procedural sedation baseline  Labs Review Labs Reviewed  CBC - Abnormal; Notable for the following:    Hemoglobin 11.1 (*)    HCT 35.4 (*)    MCH 24.5 (*)    RDW 15.6 (*)    Platelets 73 (*)    All other components within normal limits  BASIC METABOLIC PANEL - Abnormal; Notable for the following:    CO2 21 (*)    Glucose, Bld 138 (*)    All other components within normal limits    Imaging Review Dg Tibia/fibula  Left  03/26/2016  CLINICAL DATA:  Fall, left lower leg pain, kickball injury EXAM: LEFT TIBIA AND FIBULA - 2 VIEW COMPARISON:  None. FINDINGS: No fracture or dislocation is seen the proximal tibia/ fibula. Please refer to dedicated ankle radiographs for additional findings distally. IMPRESSION: No fracture or dislocation is seen in the proximal tibia/ fibula. Please refer to the dedicated ankle radiographs for additional findings distally. Electronically Signed   By: Charline Bills M.D.   On: 03/26/2016 21:51   Dg Ankle Complete Left  03/26/2016  CLINICAL DATA:  Fall, left lower leg pain, kickball  injury EXAM: LEFT ANKLE COMPLETE - 3+ VIEW COMPARISON:  None. FINDINGS: Distal fibular fracture with approximately 1/2 shaft width lateral displacement. Suspected mildly displaced posterior malleolar fracture on the lateral view. Mild widening of the anterior tibiotalar joint space on the lateral view, raising the possibility of ligamentous disruption. Additional widening of the medial ankle mortise with lateral talar shift. Associated mild to moderate soft tissue swelling. IMPRESSION: Mildly displaced distal fibular fracture, as above. Suspected mildly displaced posterior malleolar fracture. Widening of the medial ankle mortise and anterior tibiotalar joint space, raising the possibility of ligamentous disruption. Electronically Signed   By: Charline Bills M.D.   On: 03/26/2016 21:51   Dg Foot Complete Left  03/26/2016  CLINICAL DATA:  Fall, left leg pain, kickball injury EXAM: LEFT FOOT - COMPLETE 3+ VIEW COMPARISON:  None. FINDINGS: No fracture or dislocation is seen in the foot. Please refer to dedicated ankle radiographs for additional findings. IMPRESSION: No fracture or dislocation is seen in the foot. Please refer to dedicated ankle radiographs for additional findings. Electronically Signed   By: Charline Bills M.D.   On: 03/26/2016 21:49   I have personally reviewed and evaluated these images and lab results as part of my medical decision-making.   EKG Interpretation None      MDM   Final diagnoses:  Ankle fracture, left, closed, initial encounter    The pt is a 30 yo female presenting to the ED for evaluation of ankle swelling and pain after sliding into a base during kickball and twisting ankle.   On evaluation obvious deformity of ankle but neurovascular intact.  Hx of thrombocytopenia and anemia consistent with CBC in ED.  XR ankle displayed fibular fx with posterior tibial fx and likely ligamentous injury.  Discussed with on call orthopedic surgeon who recommended ED staff  reduction with splint application and NWB until f/u with Dr. Victorino Dike in 3 days.   Sedation with reduction and splint application as above.  Rx for pain medications given and to f/u with orthopedics.   Labs and images were viewed by myself  incorporated into medical decision making.  Discussed pertinent finding with patient or caregiver prior to discharge with no further questions.  Immediate return precautions given and understood.  Medical decision making supervised by my attending Dr. Renata Caprice, MD PGY-3 Emergency Medicine     Tery Sanfilippo, MD 03/27/16 1316  Laurence Spates, MD 03/27/16 [phone]

## 2016-03-26 NOTE — ED Notes (Signed)
[facility] with ankle injury. Pt was playing kickball, slid and foot got caught underneath her. No c/o neck or back pain. EMS noted obvious deformity, splint and wrapped left ankle. 20g placed in right AC, EMS gave NS 150mL and 150mcg fentanyl. Last vitals 125/79, P 94, 99% on RA.

## 2016-03-27 MED ORDER — OXYCODONE-ACETAMINOPHEN 5-325 MG PO TABS: 1.0000 | ORAL_TABLET | ORAL | Status: DC | PRN

## 2016-03-27 NOTE — Discharge Instructions (Signed)
Tibial and Fibular Fracture, Adult °Tibial and fibular fracture is a break in the bones of your lower leg (tibia and fibula). The tibia is the larger of these two bones. The fibula is the smaller of the two bones. It is on the outer side of your leg.  °CAUSES °· Low-energy injuries, such as a fall from ground level. °· High-energy injuries, such as motor vehicle injuries, gunshot wounds, or high-speed sports collisions. °RISK FACTORS °· Jumping activities. °· Repetitive stress, such as long-distance running. °· Participation in sports. °· Osteoporosis. °· Advanced age. °SIGNS AND SYMPTOMS °· Pain. °· Swelling. °· Inability to put weight on your injured leg. °· Bone deformities at the site of your injury. °· Bruising. °DIAGNOSIS  °Tibial and fibular fractures are diagnosed with the use of X-ray exams. °TREATMENT  °If you have a simple fracture of these two bones, they can be treated with simple immobilization. A cast or splint will be used on your leg to keep it from moving while it heals. Then you can begin range-of-motion exercises to regain your knee motion. °HOME CARE INSTRUCTIONS  °· Apply ice to your leg: °¨ Put ice in a plastic bag. °¨ Place a towel between your skin and the bag. °¨ Leave the ice on for 20 minutes, 2-3 times a day. °· If you have a plaster or fiberglass cast: °¨ Do not try to scratch the skin under the cast using sharp or pointed objects. °¨ Check the skin around the cast every day. You may put lotion on any red or sore areas. °¨ Keep your cast dry and clean. °· If you have a plaster splint: °¨ Wear the splint as directed. °¨ You may loosen the elastic around the splint if your toes become numb, tingle, or turn cold or blue. °· Do not put pressure on any part of your cast or splint until it is fully hardened, because it may deform. °· Your cast or splint can be protected during bathing with a plastic bag. Do not lower the cast or splint into water. °· Use crutches as directed. °· Only take  over-the-counter or prescription medicines for pain, discomfort, or fever as directed by your health care provider. °· Follow all instructions given to you by your health care provider. °· Make and keep all follow-up appointments. °SEEK MEDICAL CARE IF: °· Your pain is becoming worse rather than better or is not controlled with medicines. °· You have increased swelling or redness in the foot. °· You begin to lose feeling in your foot or toes. °SEEK IMMEDIATE MEDICAL CARE IF: °· You develop a cold or blue foot or toes on the injured side. °· You develop severe pain in your injured leg, especially if the pain is increased with movement of your toes. °MAKE SURE YOU: °· Understand these instructions. °· Will watch your condition. °· Will get help right away if you are not doing well or get worse. °  °This information is not intended to replace advice given to you by your health care provider. Make sure you discuss any questions you have with your health care provider. °  °Document Released: 05/27/2002 Document Revised: 01/19/2015 Document Reviewed: 04/16/2013 °Elsevier Interactive Patient Education ©2016 [employer] ° °

## 2016-03-29 ENCOUNTER — Other Ambulatory Visit: Payer: Self-pay | Admitting: Orthopedic Surgery

## 2016-04-04 ENCOUNTER — Inpatient Hospital Stay (HOSPITAL_COMMUNITY)
Admission: RE | Admit: 2016-04-04 | Discharge: 2016-04-04 | Disposition: A | Discharge status: Home / Self Care | Payer: Medicaid Other | Source: Ambulatory Visit

## 2016-04-04 NOTE — Pre-Procedure Instructions (Signed)
[name] [name]  04/04/2016      [id number]- [place], [place] - [address] [facility] [address] [place] [address] Phone: [phone] Fax: [phone]    Your procedure is scheduled on Thursday, July 20th, 2017.  [facility] at 7:45 A.M.   Call this number if you have problems the morning of surgery:  [phone]   Remember:  Do not eat food or drink liquids after midnight.   Take these medicines the morning of surgery with A SIP OF WATER: Albuterol Inhaler if needed (please bring with you), Cetirizine (Zyrtec) if needed, Ondansetron (Zofran) if needed, Oxycodone-acetaminophen (Percocet) if needed.  Stop taking: Aspirin, NSAIDS, Aleve, Naproxen, Ibuprofen, Advil, Motrin, BC's, Goody's, Fish oil, all herbal medications, and all vitamins.     Do not wear jewelry, make-up or nail polish.  Do not wear lotions, powders, or perfumes.    Do not shave 48 hours prior to surgery.    Do not bring valuables to the hospital.  [facility] is not responsible for any belongings or valuables.  Contacts, dentures or bridgework may not be worn into surgery.  Leave your suitcase in the car.  After surgery it may be brought to your room.  For patients admitted to the hospital, discharge time will be determined by your treatment team.  Patients discharged the day of surgery will not be allowed to drive home.   Special instructions:  Preparing for Surgery.   Please read over the following fact sheets that you were given. MRSA Information, Incentive Spirometry.    [place]- Preparing For Surgery  Before surgery, you can play an important role. Because skin is not sterile, your skin needs to be as free of germs as possible. You can reduce the number of germs on your skin by washing with CHG (chlorahexidine gluconate) Soap before surgery.  CHG is an antiseptic cleaner which kills germs and bonds with the skin to  continue killing germs even after washing.  Please do not use if you have an allergy to CHG or antibacterial soaps. If your skin becomes reddened/irritated stop using the CHG.  Do not shave (including legs and underarms) for at least 48 hours prior to first CHG shower. It is OK to shave your face.  Please follow these instructions carefully.   1. Shower the NIGHT BEFORE SURGERY and the MORNING OF SURGERY with CHG.   2. If you chose to wash your hair, wash your hair first as usual with your normal shampoo.  3. After you shampoo, rinse your hair and body thoroughly to remove the shampoo.  4. Use CHG as you would any other liquid soap. You can apply CHG directly to the skin and wash gently with a scrungie or a clean washcloth.   5. Apply the CHG Soap to your body ONLY FROM THE NECK DOWN.  Do not use on open wounds or open sores. Avoid contact with your eyes, ears, mouth and genitals (private parts). Wash genitals (private parts) with your normal soap.  6. Wash thoroughly, paying special attention to the area where your surgery will be performed.  7. Thoroughly rinse your body with warm water from the neck down.  8. DO NOT shower/wash with your normal soap after using and rinsing off the CHG Soap.  9. Pat yourself dry with a CLEAN TOWEL.   10. Wear CLEAN PAJAMAS   11. Place CLEAN SHEETS on your bed  the night of your first shower and DO NOT SLEEP WITH PETS.  Day of Surgery: Do not apply any deodorants/lotions. Please wear clean clothes to the hospital/surgery center.

## 2016-04-05 ENCOUNTER — Encounter (HOSPITAL_COMMUNITY): Payer: Self-pay | Admitting: *Deleted

## 2016-04-05 ENCOUNTER — Emergency Department (HOSPITAL_COMMUNITY): Payer: Medicaid Other

## 2016-04-05 ENCOUNTER — Emergency Department (HOSPITAL_COMMUNITY)
Admission: EM | Admit: 2016-04-05 | Discharge: 2016-04-05 | Disposition: A | Discharge status: Home / Self Care | Payer: Medicaid Other | Attending: Emergency Medicine | Admitting: Emergency Medicine

## 2016-04-05 ENCOUNTER — Encounter (HOSPITAL_COMMUNITY): Payer: Self-pay | Admitting: Emergency Medicine

## 2016-04-05 DIAGNOSIS — S99912D Unspecified injury of left ankle, subsequent encounter: Secondary | ICD-10-CM | POA: Diagnosis present

## 2016-04-05 DIAGNOSIS — S8262XD Displaced fracture of lateral malleolus of left fibula, subsequent encounter for closed fracture with routine healing: Secondary | ICD-10-CM | POA: Diagnosis not present

## 2016-04-05 DIAGNOSIS — W051XXD Fall from non-moving nonmotorized scooter, subsequent encounter: Secondary | ICD-10-CM | POA: Insufficient documentation

## 2016-04-05 DIAGNOSIS — Z79899 Other long term (current) drug therapy: Secondary | ICD-10-CM | POA: Insufficient documentation

## 2016-04-05 DIAGNOSIS — S82402D Unspecified fracture of shaft of left fibula, subsequent encounter for closed fracture with routine healing: Secondary | ICD-10-CM | POA: Insufficient documentation

## 2016-04-05 DIAGNOSIS — S82892D Other fracture of left lower leg, subsequent encounter for closed fracture with routine healing: Secondary | ICD-10-CM

## 2016-04-05 DIAGNOSIS — M25572 Pain in left ankle and joints of left foot: Secondary | ICD-10-CM

## 2016-04-05 DIAGNOSIS — W19XXXA Unspecified fall, initial encounter: Secondary | ICD-10-CM

## 2016-04-05 MED ORDER — FENTANYL CITRATE (PF) 100 MCG/2ML IJ SOLN
25.0000 ug | Freq: Once | INTRAMUSCULAR | Status: AC
  Administered 2016-04-05: 25 ug via INTRAVENOUS
  Filled 2016-04-05: qty 2

## 2016-04-05 MED ORDER — HYDROMORPHONE HCL 1 MG/ML IJ SOLN
2.0000 mg | Freq: Once | INTRAMUSCULAR | Status: AC
  Administered 2016-04-05: 2 mg via INTRAMUSCULAR
  Filled 2016-04-05: qty 2

## 2016-04-05 NOTE — ED Notes (Signed)
Pt states she has spoken with her mother on the phone and is on her way to get her.

## 2016-04-05 NOTE — ED Notes (Signed)
Pt has no ride home at this time.

## 2016-04-05 NOTE — ED Provider Notes (Signed)
CSN: [id number]     Arrival date & time 04/05/16  0856 History   First MD Initiated Contact with Patient 04/05/16 [phone]     Chief Complaint  Patient presents with  . Fall  . Ankle Pain     (Consider location/radiation/quality/duration/timing/severity/associated sxs/prior Treatment) The history is provided by the patient and medical records.     Pt with left ankle fracture on 03/26/16 presents with significant increase in pain and numbness in toes after falling off scooter and onto the left foot and ankle, twisting ankle (wheel got caught in a crack).  On 03/26/16 pt had closed displaced fibular fracture, displaced posterior malleolus fracture and widening of mortise suggesting ligamentous disruption.  It was reduced in ED and plainted.  Pt is planning to have surgery tomorrow with Dr Victorino DikeHewitt.  Denies any other injuries from fall.    Past Medical History  Diagnosis Date  . Anemia   . Obesity    Past Surgical History  Procedure Laterality Date  . Cerclage laparoscopic abdominal    . Cervical cerclage    . Arm surgery    . Tubal ligation    . Cholecystectomy N/A 02/11/2016    Procedure: LAPAROSCOPIC CHOLECYSTECTOMY WITH INTRAOPERATIVE CHOLANGIOGRAM;  Surgeon: Abigail Miyamotoouglas Blackman, MD;  Location: [facility] OR;  Service: General;  Laterality: N/A;   Family History  Problem Relation Age of Onset  . Diabetes Mother   . Hypertension Father    Social History  Substance Use Topics  . Smoking status: Never Smoker   . Smokeless tobacco: Not on file  . Alcohol Use: 0.0 oz/week    0 Standard drinks or equivalent per week   OB History    Gravida Para Term Preterm AB TAB SAB Ectopic Multiple Living   3 1        1      Review of Systems  Constitutional: Negative for fever and activity change.  Cardiovascular: Negative for leg swelling.  Musculoskeletal: Positive for arthralgias and gait problem.  Skin: Negative for color change.  Allergic/Immunologic: Negative for immunocompromised state.   Neurological: Positive for numbness. Negative for weakness.  Hematological: Does not bruise/bleed easily.  Psychiatric/Behavioral: Negative for self-injury.      Allergies  Strawberry extract  Home Medications   Prior to Admission medications   Medication Sig Start Date End Date Taking? Authorizing Provider  albuterol (PROVENTIL HFA;VENTOLIN HFA) 108 (90 Base) MCG/ACT inhaler Inhale 1-2 puffs into the lungs every 6 (six) hours as needed for wheezing or shortness of breath.    Historical Provider, MD  cetirizine (ZYRTEC) 10 MG tablet Take 10 mg by mouth daily as needed for allergies.    Historical Provider, MD  ondansetron (ZOFRAN) 4 MG tablet Take 1 tablet (4 mg total) by mouth every 8 (eight) hours as needed for nausea. 02/12/16   Avel Peaceodd Rosenbower, MD  oxyCODONE-acetaminophen (PERCOCET/ROXICET) 5-325 MG tablet Take 1-2 tablets by mouth every 4 (four) hours as needed for severe pain. 03/27/16   Tery SanfilippoMatthew Riester, MD   SpO2 100% Physical Exam  Constitutional: She appears well-developed and well-nourished. No distress.  Uncomfortable appearing  HENT:  Head: Normocephalic and atraumatic.  Neck: Neck supple.  Pulmonary/Chest: Effort normal.  Musculoskeletal:  LLE splint in place.  Tenderness throughout splint.   With splint off left ankle is tender and slightly edematous diffusely.  Compartments are soft, Small abrasion over anterior ankle shows signs of healing but with new small amount of bleeding after unwrapping splint.  Dorsalis pedis pulse is strong.  Toes  with capillary refill < 2 seconds.  Pt reports some numbness of the toes but does react to palpation of toes.    Neurological: She is alert.  Skin: She is not diaphoretic.  Nursing note and vitals reviewed.   ED Course  Procedures (including critical care time) Labs Review Labs Reviewed - No data to display  Imaging Review Dg Tibia/fibula Left  04/05/2016  CLINICAL DATA:  Recent fractures.  Fell today. EXAM: LEFT TIBIA AND  FIBULA - 2 VIEW; LEFT ANKLE COMPLETE - 3+ VIEW; LEFT FOOT - COMPLETE 3+ VIEW COMPARISON:  03/26/2016 FINDINGS: Left tibia/ fibula: There is a short leg fiberglass cast in place. The knee joint appears normal. No tibia or fibular shaft fracture. Stable ankle fractures. Left ankle: Mild medial mortise widening is stable. No change in position or alignment of the displaced distal fibular fracture. No new fractures. Left foot: The joint spaces are maintained. Bony detail is somewhat obscured by the overlying cast. Based on the prior x-rays could not exclude a nondisplaced fracture of the navicular bone. If clinically necessary a CT may be helpful for further evaluation. IMPRESSION: 1. Stable position and alignment of the ankle fractures. 2. No acute fracture of the tibia or fibula shafts. 3. Possible navicular bone fracture based on prior x-rays. Electronically Signed   By: Rudie Meyer M.D.   On: 04/05/2016 10:14   Dg Ankle Complete Left  04/05/2016  CLINICAL DATA:  Recent fractures.  Fell today. EXAM: LEFT TIBIA AND FIBULA - 2 VIEW; LEFT ANKLE COMPLETE - 3+ VIEW; LEFT FOOT - COMPLETE 3+ VIEW COMPARISON:  03/26/2016 FINDINGS: Left tibia/ fibula: There is a short leg fiberglass cast in place. The knee joint appears normal. No tibia or fibular shaft fracture. Stable ankle fractures. Left ankle: Mild medial mortise widening is stable. No change in position or alignment of the displaced distal fibular fracture. No new fractures. Left foot: The joint spaces are maintained. Bony detail is somewhat obscured by the overlying cast. Based on the prior x-rays could not exclude a nondisplaced fracture of the navicular bone. If clinically necessary a CT may be helpful for further evaluation. IMPRESSION: 1. Stable position and alignment of the ankle fractures. 2. No acute fracture of the tibia or fibula shafts. 3. Possible navicular bone fracture based on prior x-rays. Electronically Signed   By: Rudie Meyer M.D.   On:  04/05/2016 10:14   Dg Foot Complete Left  04/05/2016  CLINICAL DATA:  Recent fractures.  Fell today. EXAM: LEFT TIBIA AND FIBULA - 2 VIEW; LEFT ANKLE COMPLETE - 3+ VIEW; LEFT FOOT - COMPLETE 3+ VIEW COMPARISON:  03/26/2016 FINDINGS: Left tibia/ fibula: There is a short leg fiberglass cast in place. The knee joint appears normal. No tibia or fibular shaft fracture. Stable ankle fractures. Left ankle: Mild medial mortise widening is stable. No change in position or alignment of the displaced distal fibular fracture. No new fractures. Left foot: The joint spaces are maintained. Bony detail is somewhat obscured by the overlying cast. Based on the prior x-rays could not exclude a nondisplaced fracture of the navicular bone. If clinically necessary a CT may be helpful for further evaluation. IMPRESSION: 1. Stable position and alignment of the ankle fractures. 2. No acute fracture of the tibia or fibula shafts. 3. Possible navicular bone fracture based on prior x-rays. Electronically Signed   By: Rudie Meyer M.D.   On: 04/05/2016 10:14   I have personally reviewed and evaluated these images and lab results as  part of my medical decision-making.   EKG Interpretation None      MDM   Final diagnoses:  Fall, initial encounter  Left ankle pain  Closed left ankle fracture, with routine healing, subsequent encounter    Pt with left ankle fracture 10 days ago accidentally fell off scooter onto left ankle.  Xrays show no change or new fracture.  Splint unwrapped with ortho tech present to replace immediately.  No e/o compartment syndrome.  Small abrasion present that is not new.  Skin otherwise appears intact.  Strong pulses and capillary refill.  D/C home with plan for surgery tomorrow with Dr Victorino Dike as previously planned.  Discussed result, findings, treatment, and follow up  with patient.  Pt given return precautions.  Pt verbalizes understanding.    Trixie Dredge, PA-C 04/05/16 1319  Mancel Bale,  MD 04/06/16 [phone]

## 2016-04-05 NOTE — ED Notes (Addendum)
Pt in with c/o LLE pain after fall outside of emergency room while on scooter. Pt had recent L ankle fracture 2 weeks ago, was coming in for Pre-op for plate & screw placement tomorrow, and she landed on injured leg. Reports 10/10 pain, unable to move or feel toes. Toes warm, red and cap refill <3 sec. Leg wrapped and elevated, pt NPO.

## 2016-04-05 NOTE — Progress Notes (Signed)
*  Late Entry RRT RN was called to Main Lobby NT for a visitor who had fallen.  On my arrival to incident, visitor was lying on her back, Beth, RN from [address] ( who witnessed fall) was holding patients left leg up.  Family was present.  Vistior was crying out in pain from left leg.  Visitor was here for preop testing to have ankle fracture repaired tomorrow and was leaving the building when, she states her wheel of her rolling leg scooter got caught in a crack in the walkway and slid out from underneath her causing her to fall from the scooter and landing on her left leg.  Security was called as well as ED to bring a stretcher with a backboard.  Visitor was placed on backboard and neck secured and transported to ED.  Family and security present during transport.  Report of incident given to ED RN

## 2016-04-05 NOTE — ED Notes (Signed)
Pt request to stay in bed in hallway until her mother is able to come and get her.

## 2016-04-05 NOTE — ED Notes (Signed)
Taken to Xray.

## 2016-04-05 NOTE — ED Notes (Signed)
Pt taken out front in a wheelchair and placed into moms car.

## 2016-04-05 NOTE — Progress Notes (Signed)
Orthopedic Tech Progress Note Patient Details:  [facility]th [facility] Aug 16, 1986 [id number]  Ortho Devices Type of Ortho Device: Ace wrap, Post (short leg) splint, Stirrup splint Ortho Device/Splint Location: lle Ortho Device/Splint Interventions: Application   Javarion Douty 04/05/2016, 12:07 PM

## 2016-04-05 NOTE — Discharge Instructions (Signed)
Read the information below.  You may return to the Emergency Department at any time for worsening condition or any new symptoms that concern you.  If you develop uncontrolled pain, weakness or numbness of the extremity, severe discoloration of the skin, or you are unable to move your toes, call your orthopedist and return to the ER for a recheck.

## 2016-04-06 ENCOUNTER — Encounter (HOSPITAL_COMMUNITY)
Admission: RE | Disposition: A | Discharge status: Home / Self Care | Payer: Self-pay | Source: Ambulatory Visit | Attending: Orthopedic Surgery

## 2016-04-06 ENCOUNTER — Ambulatory Visit (HOSPITAL_COMMUNITY): Payer: Medicaid Other | Admitting: Certified Registered Nurse Anesthetist

## 2016-04-06 ENCOUNTER — Ambulatory Visit (HOSPITAL_COMMUNITY)
Admission: RE | Admit: 2016-04-06 | Discharge: 2016-04-06 | Disposition: A | Discharge status: Home / Self Care | Payer: Medicaid Other | Source: Ambulatory Visit | Attending: Orthopedic Surgery | Admitting: Orthopedic Surgery

## 2016-04-06 ENCOUNTER — Encounter (HOSPITAL_COMMUNITY): Payer: Self-pay | Admitting: Certified Registered Nurse Anesthetist

## 2016-04-06 DIAGNOSIS — Z6841 Body Mass Index (BMI) 40.0 and over, adult: Secondary | ICD-10-CM | POA: Insufficient documentation

## 2016-04-06 DIAGNOSIS — X58XXXA Exposure to other specified factors, initial encounter: Secondary | ICD-10-CM | POA: Insufficient documentation

## 2016-04-06 DIAGNOSIS — J45909 Unspecified asthma, uncomplicated: Secondary | ICD-10-CM | POA: Diagnosis not present

## 2016-04-06 DIAGNOSIS — S93422A Sprain of deltoid ligament of left ankle, initial encounter: Secondary | ICD-10-CM | POA: Diagnosis not present

## 2016-04-06 DIAGNOSIS — S82852A Displaced trimalleolar fracture of left lower leg, initial encounter for closed fracture: Secondary | ICD-10-CM | POA: Insufficient documentation

## 2016-04-06 HISTORY — PX: ORIF ANKLE FRACTURE: SHX5408

## 2016-04-06 LAB — HCG, SERUM, QUALITATIVE: PREG SERUM: NEGATIVE

## 2016-04-06 SURGERY — OPEN REDUCTION INTERNAL FIXATION (ORIF) ANKLE FRACTURE
Anesthesia: Regional | Site: Ankle | Laterality: Left

## 2016-04-06 MED ORDER — CHLORHEXIDINE GLUCONATE 4 % EX LIQD: 60.0000 mL | Freq: Once | CUTANEOUS | Status: DC

## 2016-04-06 MED ORDER — 0.9 % SODIUM CHLORIDE (POUR BTL) OPTIME
TOPICAL | Status: DC | PRN
  Administered 2016-04-06: 1000 mL

## 2016-04-06 MED ORDER — FENTANYL CITRATE (PF) 100 MCG/2ML IJ SOLN
50.0000 ug | Freq: Once | INTRAMUSCULAR | Status: AC
  Administered 2016-04-06: 50 ug via INTRAVENOUS

## 2016-04-06 MED ORDER — SODIUM CHLORIDE 0.9 % IV SOLN: INTRAVENOUS | Status: DC

## 2016-04-06 MED ORDER — CEFAZOLIN SODIUM-DEXTROSE 2-4 GM/100ML-% IV SOLN
2.0000 g | INTRAVENOUS | Status: AC
  Administered 2016-04-06: 2 g via INTRAVENOUS

## 2016-04-06 MED ORDER — SENNA 8.6 MG PO TABS: 2.0000 | ORAL_TABLET | Freq: Two times a day (BID) | ORAL | Status: DC

## 2016-04-06 MED ORDER — ARTIFICIAL TEARS OP OINT
TOPICAL_OINTMENT | OPHTHALMIC | Status: AC
  Filled 2016-04-06: qty 3.5

## 2016-04-06 MED ORDER — PROPOFOL 10 MG/ML IV BOLUS
INTRAVENOUS | Status: AC
  Filled 2016-04-06: qty 20

## 2016-04-06 MED ORDER — LIDOCAINE HCL (CARDIAC) 20 MG/ML IV SOLN
INTRAVENOUS | Status: DC | PRN
  Administered 2016-04-06: 80 mg via INTRAVENOUS

## 2016-04-06 MED ORDER — MIDAZOLAM HCL 2 MG/2ML IJ SOLN
INTRAMUSCULAR | Status: AC
  Filled 2016-04-06: qty 2

## 2016-04-06 MED ORDER — FENTANYL CITRATE (PF) 100 MCG/2ML IJ SOLN
100.0000 ug | Freq: Once | INTRAMUSCULAR | Status: AC
  Administered 2016-04-06: 100 ug via INTRAVENOUS

## 2016-04-06 MED ORDER — OXYCODONE HCL 5 MG PO TABS
5.0000 mg | ORAL_TABLET | Freq: Once | ORAL | Status: AC | PRN
  Administered 2016-04-06: 5 mg via ORAL

## 2016-04-06 MED ORDER — EPHEDRINE 5 MG/ML INJ
INTRAVENOUS | Status: AC
  Filled 2016-04-06: qty 10

## 2016-04-06 MED ORDER — DOCUSATE SODIUM 100 MG PO CAPS: 100.0000 mg | ORAL_CAPSULE | Freq: Two times a day (BID) | ORAL | Status: DC

## 2016-04-06 MED ORDER — PROPOFOL 10 MG/ML IV BOLUS
INTRAVENOUS | Status: DC | PRN
Start: 2016-04-06 — End: 2016-04-06
  Administered 2016-04-06: 200 mg via INTRAVENOUS

## 2016-04-06 MED ORDER — ONDANSETRON HCL 4 MG/2ML IJ SOLN
INTRAMUSCULAR | Status: AC
  Filled 2016-04-06: qty 2

## 2016-04-06 MED ORDER — FENTANYL CITRATE (PF) 100 MCG/2ML IJ SOLN
INTRAMUSCULAR | Status: AC
  Administered 2016-04-06: 100 ug via INTRAVENOUS
  Filled 2016-04-06: qty 2

## 2016-04-06 MED ORDER — MIDAZOLAM HCL 2 MG/2ML IJ SOLN
INTRAMUSCULAR | Status: AC
  Administered 2016-04-06: 2 mg via INTRAVENOUS
  Filled 2016-04-06: qty 2

## 2016-04-06 MED ORDER — BUPIVACAINE-EPINEPHRINE (PF) 0.5% -1:200000 IJ SOLN
INTRAMUSCULAR | Status: DC | PRN
  Administered 2016-04-06: 20 mL via PERINEURAL
  Administered 2016-04-06: 25 mL via PERINEURAL

## 2016-04-06 MED ORDER — OXYCODONE HCL 5 MG PO TABS: 5.0000 mg | ORAL_TABLET | ORAL | Status: DC | PRN

## 2016-04-06 MED ORDER — LACTATED RINGERS IV SOLN
INTRAVENOUS | Status: DC
  Administered 2016-04-06 (×2): via INTRAVENOUS

## 2016-04-06 MED ORDER — OXYCODONE HCL 5 MG/5ML PO SOLN: 5.0000 mg | Freq: Once | ORAL | Status: AC | PRN

## 2016-04-06 MED ORDER — ONDANSETRON HCL 4 MG/2ML IJ SOLN
4.0000 mg | Freq: Four times a day (QID) | INTRAMUSCULAR | Status: AC | PRN
  Administered 2016-04-06: 4 mg via INTRAVENOUS

## 2016-04-06 MED ORDER — DEXAMETHASONE SODIUM PHOSPHATE 10 MG/ML IJ SOLN
INTRAMUSCULAR | Status: DC | PRN
  Administered 2016-04-06: 5 mg via INTRAVENOUS

## 2016-04-06 MED ORDER — FENTANYL CITRATE (PF) 100 MCG/2ML IJ SOLN
INTRAMUSCULAR | Status: DC | PRN
  Administered 2016-04-06: 50 ug via INTRAVENOUS
  Administered 2016-04-06 (×2): 25 ug via INTRAVENOUS
  Administered 2016-04-06: 50 ug via INTRAVENOUS

## 2016-04-06 MED ORDER — MIDAZOLAM HCL 5 MG/5ML IJ SOLN
INTRAMUSCULAR | Status: DC | PRN
  Administered 2016-04-06: 2 mg via INTRAVENOUS

## 2016-04-06 MED ORDER — MIDAZOLAM HCL 2 MG/2ML IJ SOLN
2.0000 mg | Freq: Once | INTRAMUSCULAR | Status: AC
  Administered 2016-04-06: 2 mg via INTRAVENOUS

## 2016-04-06 MED ORDER — DEXAMETHASONE SODIUM PHOSPHATE 10 MG/ML IJ SOLN
INTRAMUSCULAR | Status: AC
  Filled 2016-04-06: qty 1

## 2016-04-06 MED ORDER — HYDROMORPHONE HCL 1 MG/ML IJ SOLN
INTRAMUSCULAR | Status: AC
  Filled 2016-04-06: qty 1

## 2016-04-06 MED ORDER — CEFAZOLIN SODIUM-DEXTROSE 2-4 GM/100ML-% IV SOLN
INTRAVENOUS | Status: AC
  Filled 2016-04-06: qty 100

## 2016-04-06 MED ORDER — LIDOCAINE 2% (20 MG/ML) 5 ML SYRINGE
INTRAMUSCULAR | Status: AC
  Filled 2016-04-06: qty 10

## 2016-04-06 MED ORDER — FENTANYL CITRATE (PF) 100 MCG/2ML IJ SOLN
INTRAMUSCULAR | Status: AC
  Administered 2016-04-06: 50 ug via INTRAVENOUS
  Filled 2016-04-06: qty 2

## 2016-04-06 MED ORDER — OXYCODONE HCL 5 MG PO TABS
ORAL_TABLET | ORAL | Status: AC
  Filled 2016-04-06: qty 1

## 2016-04-06 MED ORDER — ONDANSETRON HCL 4 MG/2ML IJ SOLN
INTRAMUSCULAR | Status: DC | PRN
  Administered 2016-04-06: 4 mg via INTRAVENOUS

## 2016-04-06 MED ORDER — FENTANYL CITRATE (PF) 250 MCG/5ML IJ SOLN
INTRAMUSCULAR | Status: AC
  Filled 2016-04-06: qty 5

## 2016-04-06 MED ORDER — HYDROMORPHONE HCL 1 MG/ML IJ SOLN
0.2500 mg | INTRAMUSCULAR | Status: DC | PRN
Start: 2016-04-06 — End: 2016-04-08
  Administered 2016-04-06 (×2): 0.25 mg via INTRAVENOUS
  Administered 2016-04-06: 0.5 mg via INTRAVENOUS

## 2016-04-06 SURGICAL SUPPLY — 71 items
BANDAGE ESMARK 6X9 LF (GAUZE/BANDAGES/DRESSINGS) ×1 IMPLANT
BIT DRILL 2.5X2.75 QC CALB (BIT) ×3 IMPLANT
BIT DRILL 2.9 CANN QC NONSTRL (BIT) ×3 IMPLANT
BIT DRILL 3.5X5.5 QC CALB (BIT) ×3 IMPLANT
BLADE SURG 15 STRL LF DISP TIS (BLADE) ×1 IMPLANT
BLADE SURG 15 STRL SS (BLADE) ×2
BNDG COHESIVE 4X5 TAN STRL (GAUZE/BANDAGES/DRESSINGS) ×3 IMPLANT
BNDG COHESIVE 6X5 TAN STRL LF (GAUZE/BANDAGES/DRESSINGS) ×3 IMPLANT
BNDG ESMARK 6X9 LF (GAUZE/BANDAGES/DRESSINGS) ×3
CANISTER SUCT 3000ML PPV (MISCELLANEOUS) ×3 IMPLANT
CHLORAPREP W/TINT 26ML (MISCELLANEOUS) ×3 IMPLANT
COVER SURGICAL LIGHT HANDLE (MISCELLANEOUS) ×3 IMPLANT
CUFF TOURNIQUET SINGLE 44IN (TOURNIQUET CUFF) ×3 IMPLANT
DRAPE OEC MINIVIEW 54X84 (DRAPES) ×3 IMPLANT
DRAPE U-SHAPE 47X51 STRL (DRAPES) ×3 IMPLANT
DRSG MEPITEL 4X7.2 (GAUZE/BANDAGES/DRESSINGS) ×3 IMPLANT
DRSG PAD ABDOMINAL 8X10 ST (GAUZE/BANDAGES/DRESSINGS) ×6 IMPLANT
ELECT REM PT RETURN 9FT ADLT (ELECTROSURGICAL) ×3
ELECTRODE REM PT RTRN 9FT ADLT (ELECTROSURGICAL) ×1 IMPLANT
GLOVE BIO SURGEON STRL SZ7 (GLOVE) ×3 IMPLANT
GLOVE BIO SURGEON STRL SZ8 (GLOVE) ×6 IMPLANT
GLOVE BIOGEL PI IND STRL 7.0 (GLOVE) ×2 IMPLANT
GLOVE BIOGEL PI IND STRL 8 (GLOVE) ×3 IMPLANT
GLOVE BIOGEL PI INDICATOR 7.0 (GLOVE) ×4
GLOVE BIOGEL PI INDICATOR 8 (GLOVE) ×6
GLOVE ECLIPSE 7.5 STRL STRAW (GLOVE) ×3 IMPLANT
GOWN STRL REUS W/ TWL LRG LVL3 (GOWN DISPOSABLE) ×1 IMPLANT
GOWN STRL REUS W/ TWL XL LVL3 (GOWN DISPOSABLE) ×2 IMPLANT
GOWN STRL REUS W/TWL LRG LVL3 (GOWN DISPOSABLE) ×2
GOWN STRL REUS W/TWL XL LVL3 (GOWN DISPOSABLE) ×4
KIT BASIN OR (CUSTOM PROCEDURE TRAY) ×3 IMPLANT
KIT ROOM TURNOVER OR (KITS) ×3 IMPLANT
NS IRRIG 1000ML POUR BTL (IV SOLUTION) ×3 IMPLANT
PACK ORTHO EXTREMITY (CUSTOM PROCEDURE TRAY) ×3 IMPLANT
PAD ABD 8X10 STRL (GAUZE/BANDAGES/DRESSINGS) ×6 IMPLANT
PAD ARMBOARD 7.5X6 YLW CONV (MISCELLANEOUS) ×6 IMPLANT
PAD CAST 4YDX4 CTTN HI CHSV (CAST SUPPLIES) ×1 IMPLANT
PADDING CAST COTTON 4X4 STRL (CAST SUPPLIES) ×2
PADDING CAST COTTON 6X4 STRL (CAST SUPPLIES) ×3 IMPLANT
PLATE ACE 100DEG 7HOLE (Plate) ×3 IMPLANT
SCREW ACE CAN 4.0 44M (Screw) ×3 IMPLANT
SCREW CORTICAL 3.5MM  16MM (Screw) ×6 IMPLANT
SCREW CORTICAL 3.5MM  28MM (Screw) ×2 IMPLANT
SCREW CORTICAL 3.5MM  60MM (Screw) ×2 IMPLANT
SCREW CORTICAL 3.5MM 14MM (Screw) ×3 IMPLANT
SCREW CORTICAL 3.5MM 16MM (Screw) ×3 IMPLANT
SCREW CORTICAL 3.5MM 18MM (Screw) ×3 IMPLANT
SCREW CORTICAL 3.5MM 28MM (Screw) ×1 IMPLANT
SCREW CORTICAL 3.5MM 50MM (Screw) ×3 IMPLANT
SCREW CORTICAL 3.5MM 60MM (Screw) ×1 IMPLANT
SPONGE GAUZE 4X4 12PLY STER LF (GAUZE/BANDAGES/DRESSINGS) ×3 IMPLANT
SPONGE LAP 18X18 X RAY DECT (DISPOSABLE) ×3 IMPLANT
STAPLER VISISTAT 35W (STAPLE) IMPLANT
SUCTION FRAZIER HANDLE 10FR (MISCELLANEOUS) ×4
SUCTION TUBE FRAZIER 10FR DISP (MISCELLANEOUS) ×2 IMPLANT
SUT ETHILON 3 0 PS 1 (SUTURE) ×6 IMPLANT
SUT MNCRL AB 3-0 PS2 18 (SUTURE) IMPLANT
SUT PDS AB 0 CT 36 (SUTURE) ×3 IMPLANT
SUT PROLENE 3 0 PS 2 (SUTURE) IMPLANT
SUT VIC AB 0 CT1 27 (SUTURE) ×2
SUT VIC AB 0 CT1 27XBRD ANBCTR (SUTURE) ×1 IMPLANT
SUT VIC AB 2-0 CT1 27 (SUTURE) ×4
SUT VIC AB 2-0 CT1 TAPERPNT 27 (SUTURE) ×2 IMPLANT
SUT VIC AB 3-0 PS2 18 (SUTURE)
SUT VIC AB 3-0 PS2 18XBRD (SUTURE) IMPLANT
TOWEL OR 17X24 6PK STRL BLUE (TOWEL DISPOSABLE) ×3 IMPLANT
TOWEL OR 17X26 10 PK STRL BLUE (TOWEL DISPOSABLE) ×3 IMPLANT
TUBE CONNECTING 12'X1/4 (SUCTIONS) ×2
TUBE CONNECTING 12X1/4 (SUCTIONS) ×4 IMPLANT
WATER STERILE IRR 1000ML POUR (IV SOLUTION) ×3 IMPLANT
WIRE K 1.6MM 144256 (MISCELLANEOUS) ×3 IMPLANT

## 2016-04-06 NOTE — H&P (Signed)
[name] MelnickKeira M [name] is an 30 y.o. female.   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  She has a displaced lateral malleolus fracture and a possible deltoid ligament injury.  She presents today for ORIF of the left ankle fracture and possible repair of the deltoid ligament.  She recently tolerated a lap chole by Dr. Magnus IvanBlackman with a platelet count of about 55k.  Platelet count today is 73k.  Past Medical History  Diagnosis Date  . Anemia   . Obesity   . Asthma   . Hx gestational diabetes   . Headache     migraines  . PONV (postoperative nausea and vomiting)     after cholecystectomy    Past Surgical History  Procedure Laterality Date  . Cerclage laparoscopic abdominal    . Cervical cerclage    . Arm surgery    . Tubal ligation    . Cholecystectomy N/A 02/11/2016    Procedure: LAPAROSCOPIC CHOLECYSTECTOMY WITH INTRAOPERATIVE CHOLANGIOGRAM;  Surgeon: Abigail Miyamotoouglas Blackman, MD;  Location: [facility] OR;  Service: General;  Laterality: N/A;    Family History  Problem Relation Age of Onset  . Diabetes Mother   . Thyroid disease Mother   . Hypertension Father   . Heart attack Father    Social History:  reports that she has never smoked. She has never used smokeless tobacco. She reports that she drinks alcohol. She reports that she does not use illicit drugs.  Allergies:  Allergies  Allergen Reactions  . Strawberry Extract Shortness Of Breath    "throat closes up"    Medications Prior to Admission  Medication Sig Dispense Refill  . cetirizine (ZYRTEC) 10 MG tablet Take 10 mg by mouth daily as needed for allergies.    Marland Kitchen. ondansetron (ZOFRAN) 4 MG tablet Take 1 tablet (4 mg total) by mouth every 8 (eight) hours as needed for nausea. 20 tablet 0  . ondansetron (ZOFRAN) 4 MG tablet Take 4 mg by mouth every 8 (eight) hours as needed for nausea or vomiting.    Marland Kitchen. oxyCODONE-acetaminophen (PERCOCET/ROXICET)  5-325 MG tablet Take 1-2 tablets by mouth every 4 (four) hours as needed for severe pain. 15 tablet 0  . albuterol (PROVENTIL HFA;VENTOLIN HFA) 108 (90 Base) MCG/ACT inhaler Inhale 1-2 puffs into the lungs every 6 (six) hours as needed for wheezing or shortness of breath.    . naproxen (NAPROSYN) 500 MG tablet Take 500 mg by mouth 2 (two) times daily with a meal.      Results for orders placed or performed during the hospital encounter of 04/06/16 (from the past 48 hour(s))  hCG, serum, qualitative     Status: None   Collection Time: 04/06/16  8:39 AM  Result Value Ref Range   Preg, Serum NEGATIVE NEGATIVE    Comment:        THE SENSITIVITY OF THIS METHODOLOGY IS >10 mIU/mL.    Dg Tibia/fibula Left  04/05/2016  CLINICAL DATA:  Recent fractures.  Fell today. EXAM: LEFT TIBIA AND FIBULA - 2 VIEW; LEFT ANKLE COMPLETE - 3+ VIEW; LEFT FOOT - COMPLETE 3+ VIEW COMPARISON:  03/26/2016 FINDINGS: Left tibia/ fibula: There is a short leg fiberglass cast in place. The knee joint appears normal. No tibia or fibular shaft fracture. Stable ankle fractures. Left ankle: Mild medial mortise widening is stable. No change in position or alignment of the displaced distal fibular fracture. No new  fractures. Left foot: The joint spaces are maintained. Bony detail is somewhat obscured by the overlying cast. Based on the prior x-rays could not exclude a nondisplaced fracture of the navicular bone. If clinically necessary a CT may be helpful for further evaluation. IMPRESSION: 1. Stable position and alignment of the ankle fractures. 2. No acute fracture of the tibia or fibula shafts. 3. Possible navicular bone fracture based on prior x-rays. Electronically Signed   By: Rudie Meyer M.D.   On: 04/05/2016 10:14   Dg Ankle Complete Left  04/05/2016  CLINICAL DATA:  Recent fractures.  Fell today. EXAM: LEFT TIBIA AND FIBULA - 2 VIEW; LEFT ANKLE COMPLETE - 3+ VIEW; LEFT FOOT - COMPLETE 3+ VIEW COMPARISON:  03/26/2016 FINDINGS:  Left tibia/ fibula: There is a short leg fiberglass cast in place. The knee joint appears normal. No tibia or fibular shaft fracture. Stable ankle fractures. Left ankle: Mild medial mortise widening is stable. No change in position or alignment of the displaced distal fibular fracture. No new fractures. Left foot: The joint spaces are maintained. Bony detail is somewhat obscured by the overlying cast. Based on the prior x-rays could not exclude a nondisplaced fracture of the navicular bone. If clinically necessary a CT may be helpful for further evaluation. IMPRESSION: 1. Stable position and alignment of the ankle fractures. 2. No acute fracture of the tibia or fibula shafts. 3. Possible navicular bone fracture based on prior x-rays. Electronically Signed   By: Rudie Meyer M.D.   On: 04/05/2016 10:14   Dg Foot Complete Left  04/05/2016  CLINICAL DATA:  Recent fractures.  Fell today. EXAM: LEFT TIBIA AND FIBULA - 2 VIEW; LEFT ANKLE COMPLETE - 3+ VIEW; LEFT FOOT - COMPLETE 3+ VIEW COMPARISON:  03/26/2016 FINDINGS: Left tibia/ fibula: There is a short leg fiberglass cast in place. The knee joint appears normal. No tibia or fibular shaft fracture. Stable ankle fractures. Left ankle: Mild medial mortise widening is stable. No change in position or alignment of the displaced distal fibular fracture. No new fractures. Left foot: The joint spaces are maintained. Bony detail is somewhat obscured by the overlying cast. Based on the prior x-rays could not exclude a nondisplaced fracture of the navicular bone. If clinically necessary a CT may be helpful for further evaluation. IMPRESSION: 1. Stable position and alignment of the ankle fractures. 2. No acute fracture of the tibia or fibula shafts. 3. Possible navicular bone fracture based on prior x-rays. Electronically Signed   By: Rudie Meyer M.D.   On: 04/05/2016 10:14    ROS  No recent f/c/n/v/wt loss  Blood pressure 123/64, pulse 66, temperature 98.1 F (36.7  C), temperature source Oral, resp. rate 22, height  (1.626 m), weight 108.863 kg (240 lb), last menstrual period 04/01/2016, SpO2 100 %. Physical Exam  wn wd female in nad.  A and O x 4.  Mood and affect normal.  EOMI.  resp unlabored.  L ankle with healthy skin and moderate swelling.  No lymphadenopathy.  5/5 strength in PF, DF of the toes.  Sens to LT intact.  No bruising.  Assessment/Plan L ankle lateral malleolus fracture and possible deltoid ligament injury.  To OR for ORIF of the lateral mal and possible deltoid ligament repair.  The risks and benefits of the alternative treatment options have been discussed in detail.  The patient wishes to proceed with surgery and specifically understands risks of bleeding, infection, nerve damage, blood clots, need for additional surgery, amputation and  deathToni Arthurs, MD 04/06/2016, 10:11 AM

## 2016-04-06 NOTE — Progress Notes (Signed)
Patient claims pain is going down, 5/10 after adductor canal  and popliteal block by Dr. Renold DonGermeroth.

## 2016-04-06 NOTE — Discharge Instructions (Signed)
Toni ArthursJohn Hewitt, MD [facility]  Please read the following information regarding your care after surgery.  Medications  You only need a prescription for the narcotic pain medicine (ex. oxycodone, Percocet, Norco).  All of the other medicines listed below are available over the counter. X acetominophen (Tylenol) 650 mg every 4-6 hours as you need for minor pain X oxycodone as prescribed for moderate to severe pain ?   Narcotic pain medicine (ex. oxycodone, Percocet, Vicodin) will cause constipation.  To prevent this problem, take the following medicines while you are taking any pain medicine. X docusate sodium (Colace) 100 mg twice a day X senna (Senokot) 2 tablets twice a day  Weight Bearing X Do not bear any weight on the operated leg or foot.  Cast / Splint / Dressing X Keep your splint or cast clean and dry.  Dont put anything (coat hanger, pencil, etc) down inside of it.  If it gets damp, use a hair dryer on the cool setting to dry it.  If it gets soaked, call the office to schedule an appointment for a cast change.   After your dressing, cast or splint is removed; you may shower, but do not soak or scrub the wound.  Allow the water to run over it, and then gently pat it dry.  Swelling It is normal for you to have swelling where you had surgery.  To reduce swelling and pain, keep your toes above your nose for at least 3 days after surgery.  It may be necessary to keep your foot or leg elevated for several weeks.  If it hurts, it should be elevated.  Follow Up Call my office at [phone] when you are discharged from the hospital or surgery center to schedule an appointment to be seen two weeks after surgery.  Call my office at [phone] if you develop a fever >101.5 F, nausea, vomiting, bleeding from the surgical site or severe pain.

## 2016-04-06 NOTE — Anesthesia Postprocedure Evaluation (Signed)
Anesthesia Post Note  Patient: [name] [name] [facility]  Procedure(s) Performed: Procedure(s) (LRB): OPEN REDUCTION INTERNAL FIXATION (ORIF) LEFT BIMALLEOLAR ANKLE FRACTURE WITH POSSIBLE DELTOID LIGAMENT REPAIR (Left)  Patient location during evaluation: PACU Anesthesia Type: General and Regional Level of consciousness: sedated and patient cooperative Pain management: pain level controlled Vital Signs Assessment: post-procedure vital signs reviewed and stable Respiratory status: spontaneous breathing Cardiovascular status: stable Anesthetic complications: no Comments: Pt complaining of medial and lateral malleolar pain in PACU 9/10. Pt unable to move toes. Discussed with family that block likely only getting peroneal branch of sciatic. Reblocked both AC and pop in PACU with 50 ml total of 0.5% bupiv c epi given split 30/20, pop/ac. Good perineural spread noted on U/S. Pt tolerated procedure well and had good relief of pain.    Last Vitals:  Filed Vitals:   04/06/16 1405 04/06/16 1435  BP: 122/79 134/86  Pulse: 64 63  Temp:    Resp: 20 18    Last Pain:  Filed Vitals:   04/06/16 1452  PainSc: 10-Worst pain ever                 Lewie LoronJohn Ford Peddie

## 2016-04-06 NOTE — Anesthesia Preprocedure Evaluation (Signed)
Anesthesia Evaluation  Patient identified by MRN, date of birth, ID band Patient awake    Reviewed: Allergy & Precautions, H&P , NPO status , Patient's Chart, lab work & pertinent test results  History of Anesthesia Complications (+) PONV  Airway Mallampati: II   Neck ROM: full    Dental   Pulmonary asthma ,    breath sounds clear to auscultation       Cardiovascular negative cardio ROS   Rhythm:regular Rate:Normal     Neuro/Psych  Headaches,    GI/Hepatic   Endo/Other  Morbid obesity  Renal/GU      Musculoskeletal   Abdominal   Peds  Hematology   Anesthesia Other Findings   Reproductive/Obstetrics                             Anesthesia Physical Anesthesia Plan  ASA: II  Anesthesia Plan: General and Regional   Post-op Pain Management:  Regional for Post-op pain   Induction: Intravenous  Airway Management Planned: LMA  Additional Equipment:   Intra-op Plan:   Post-operative Plan:   Informed Consent: I have reviewed the patients History and Physical, chart, labs and discussed the procedure including the risks, benefits and alternatives for the proposed anesthesia with the patient or authorized representative who has indicated his/her understanding and acceptance.     Plan Discussed with: CRNA, Anesthesiologist and Surgeon  Anesthesia Plan Comments:         Anesthesia Quick Evaluation

## 2016-04-06 NOTE — Progress Notes (Signed)
Notified Dr. Chaney MallingHodierne of pt platelet level, no new labs ordered at this time.

## 2016-04-06 NOTE — Brief Op Note (Signed)
04/06/2016  12:56 PM  PATIENT:  [name] [name]  30 y.o. female  PRE-OPERATIVE DIAGNOSIS:  left ankle bimalleolar fracture  POST-OPERATIVE DIAGNOSIS:  left ankle trimalleolar fracture, syndesmosis disruption, deltoid ligament rupture  Procedure(s): 1.  OPEN REDUCTION INTERNAL FIXATION (ORIF) LEFT BIMALLEOLAR ANKLE FRACTURe 2.  Left DELTOID LIGAMENT REPAIR 3.  ORIF left ankle syndesmosis 4.  AP, mortise and lateral xrays of the left ankle 5.  Stress exam of left ankle under fluoro  SURGEON:  Toni ArthursJohn Sonia Bromell, MD  ASSISTANT: Alfredo MartinezJustin Ollis, PA-C  ANESTHESIA:   General, regional  EBL:  minimal   TOURNIQUET:   Total Tourniquet Time Documented: Thigh (Left) - 88 minutes Total: Thigh (Left) - 88 minutes  COMPLICATIONS:  None apparent  DISPOSITION:  Extubated, awake and stable to recovery.  DICTATION ID:  [id number]

## 2016-04-06 NOTE — Transfer of Care (Signed)
Immediate Anesthesia Transfer of Care Note  Patient: [name] [name]  Procedure(s) Performed: Procedure(s): OPEN REDUCTION INTERNAL FIXATION (ORIF) LEFT BIMALLEOLAR ANKLE FRACTURE WITH POSSIBLE DELTOID LIGAMENT REPAIR (Left)  Patient Location: PACU  Anesthesia Type:GA combined with regional for post-op pain  Level of Consciousness: awake and alert   Airway & Oxygen Therapy: Patient Spontanous Breathing and Patient connected to nasal cannula oxygen  Post-op Assessment: Report given to RN and Post -op Vital signs reviewed and stable  Post vital signs: Reviewed and stable  Last Vitals:  Filed Vitals:   04/06/16 0959 04/06/16 1011  BP: 118/63 123/64  Pulse: 71 66  Temp:    Resp: 19 22    Last Pain:  Filed Vitals:   04/06/16 1011  PainSc: 9       Patients Stated Pain Goal: 3 (04/06/16 0828)  Complications: no anesthesia complications

## 2016-04-06 NOTE — Anesthesia Procedure Notes (Addendum)
Anesthesia Regional Block:  Popliteal block  Pre-Anesthetic Checklist: ,, timeout performed, Correct Patient, Correct Site, Correct Laterality, Correct Procedure, Correct Position, site marked, Risks and benefits discussed,  Surgical consent,  Pre-op evaluation,  At surgeon's request and post-op pain management  Laterality: Left  Prep: chloraprep       Needles:  Injection technique: Single-shot  Needle Type: Echogenic Stimulator Needle     Needle Length: 9cm 9 cm Needle Gauge: 21 and 21 G    Additional Needles:  Procedures: ultrasound guided (picture in chart) and nerve stimulator Popliteal block  Nerve Stimulator or Paresthesia:  Response: plantar flexion of foot, 0.45 mA,   Additional Responses:   Narrative:  Start time: 04/06/2016 9:40 AM End time: 04/06/2016 9:48 AM Injection made incrementally with aspirations every 5 mL.  Performed by: Personally  Anesthesiologist: HODIERNE, ADAM  Additional Notes: Functioning IV was confirmed and monitors were applied.  A 90mm 21ga Arrow echogenic stimulator needle was used. Sterile prep and drape,hand hygiene and sterile gloves were used.  Negative aspiration and negative test dose prior to incremental administration of local anesthetic(25 mL 0.5% bupivacaine +epi). The patient tolerated the procedure well.  Ultrasound guidance: relevent anatomy identified, needle position confirmed, local anesthetic spread visualized around nerve(s), vascular puncture avoided.  Image printed for medical record.   Left adductor canal block also performed.  Sterile technique.  Ultrasound guidance.  20 mL of 0.5% bupivacaine injected at this site.  Pt tolerated the procedure well.   Procedure Name: LMA Insertion Date/Time: 04/06/2016 10:39 AM Performed by: Margaree MackintoshYACOUB, Namari Breton B Pre-anesthesia Checklist: Patient identified, Emergency Drugs available, Suction available, Patient being monitored and Timeout performed Patient Re-evaluated:Patient Re-evaluated  prior to inductionOxygen Delivery Method: Circle system utilized Preoxygenation: Pre-oxygenation with 100% oxygen Intubation Type: IV induction LMA: LMA inserted LMA Size: 4.0 Number of attempts: 1 Placement Confirmation: positive ETCO2 and breath sounds checked- equal and bilateral Tube secured with: Tape Dental Injury: Teeth and Oropharynx as per pre-operative assessment

## 2016-04-06 NOTE — Progress Notes (Signed)
Report given to erika rn as caregiver 

## 2016-04-07 ENCOUNTER — Encounter (HOSPITAL_COMMUNITY): Payer: Self-pay | Admitting: Orthopedic Surgery

## 2016-04-07 NOTE — Op Note (Signed)
[facility] [place]:  Blasko, Sydelle                ACCOUNT NO.:  [id number]  MEDICAL RECORD NO.:  [id number]  LOCATION:                                 FACILITY:  PHYSICIAN:  Toni ArthursJohn Jash Wahlen, MD        DATE OF BIRTH:  1986-07-10  DATE OF PROCEDURE:  04/06/2016 DATE OF DISCHARGE:                              OPERATIVE REPORT   POSTOPERATIVE DIAGNOSIS:  Left ankle bimalleolar fracture.  POSTOPERATIVE DIAGNOSES: 1. Left ankle bimalleolar fracture (lateral and posterior malleoli). 2. Left ankle syndesmosis disruption. 3. Left deltoid ligament rupture.  PROCEDURE: 1. Open reduction and internal fixation of left ankle bimalleolar     fracture including fixation of the lateral and posterior malleoli. 2. Repair of left deltoid ligament. 3. Open reduction and internal fixation of left ankle syndesmosis. 4. AP mortise and lateral radiographs of the left ankle. 5. Stress examination of left ankle under fluoroscopy.  SURGEON:  Toni ArthursJohn Clevland Cork, M.D.  ASSISTANT:  Alfredo MartinezJustin Ollis, PA-C.  ANESTHESIA:  General, regional.  ESTIMATED BLOOD LOSS:  Minimal.  TOURNIQUET TIME:  88 minutes at 300 mmHg.  COMPLICATIONS:  None apparent.  DISPOSITION:  Extubated, awake, and stable to recovery room.  INDICATION FOR PROCEDURE:  The patient is a 30 year old female with past medical history significant for idiopathic thrombocytopenia.  She sustained an injury to her left ankle about a week ago.  Radiographs revealed a displaced bimalleolar fracture with widening of the medial clear space.  She has a platelet count this morning of 77,000.  She presents now for operative treatment of this unstable and displaced left ankle injury.  She understands the risks and benefits, the alternative treatment options, and elects surgical treatment.  She specifically understands the risks of bleeding, infection, nerve damage, blood clots, need for additional surgery, continued pain, nonunion, posttraumatic arthritis, amputation, and  death.  PROCEDURE IN DETAIL:  After preoperative consent was obtained and the correct operative site was identified, the patient was brought to the operating room and placed supine on the operating table.  General anesthesia was induced.  Preop antibiotics were administered.  Surgical time-out was taken.  The left lower extremity was prepped and draped in standard sterile fashion with tourniquet around the thigh.  The extremity was exsanguinated.  The tourniquet was inflated to 300 mmHg. A longitudinal incision was then made over the lateral malleolus.  Sharp dissection was carried down through the skin and subcutaneous tissue. The fracture site was identified.  It was cleaned of all hematoma and irrigated copiously.  A posterior malleolus fragment was noted as well. This was noted to be significantly displaced.  A posterolateral approach was then made posterior to the fibula.  This fragment was manipulated and could not successfully be reduced.  The incision was then made at the medial malleolus.  The deltoid ligament was noted to be completely ruptured.  The posterior tibial tendon sheath was also opened.  This was extended both proximally exposing the posteromedial aspect of the distal tibia.  There was posterior malleolus fracture extension into this area. The fracture was reduced and provisionally pinned.  Lateral fracture was then reduced and held with a tenaculum.  Fibular fracture was reduced  and held with a lobster claw.  A K-wire was inserted from anterior to posterior across the posterior malleolus fracture site.  The pin was overdrilled and a 4-mm partially threaded cannulated screw was inserted over the guide pin.  It was noted to have excellent purchase and held the posterior malleolar fracture fragments in place appropriately.  The lateral fracture was then reduced and held with a tenaculum.  A 3.5-mm fully-threaded lag screw was inserted from anterior-posterior across  the fracture site.  It was noted to have excellent purchase and compressed the fracture site appropriately.  A one-third tubular plate was then contoured to fit the lateral malleolus.  This was secured distally with 3 unicortical screws and proximally with 3 bicortical screws.  A stress examination was then performed.  A mortise view was obtained. Dorsiflexion and external rotation stress was applied under live fluoroscopy.  The syndesmosis was noted to be grossly unstable with widening of approximately 1 cm.  There was also widening evident at the medial clear space.  A [place] clamp was then placed from the distal fibula to the distal medial malleolus.  It was compressed with the ankle held in neutral dorsiflexion.  Two 3.5 mm fully-threaded screws were inserted across all 4 cortices of the distal fibula and distal tibia securing the syndesmosis appropriately.  The deltoid ligament was then repaired with figure-of-eight and imbricating sutures of 0 Vicryl.  This included both the deep and superficial layers.  Final AP, mortise, and lateral radiographs showed appropriate position and length of all hardware and appropriate reduction of the fractures.  Both wounds were then irrigated copiously. The deep subcutaneous tissues were approximated with inverted simple sutures of 0 Vicryl.  Superficial subcutaneous tissues were approximated with inverted simple sutures of 3-0 Monocryl.  Skin incisions were closed with 3-0 nylon.  Sterile dressings were applied followed by a well-padded short-leg splint.  Tourniquet was released after application of the dressings at 88 minutes.  The patient was awakened by anesthesia and transported to the recovery room in stable condition.  FOLLOWUP PLAN:  The patient will be nonweightbearing on the left lower extremity.  She will take aspirin for DVT prophylaxis and follow up with me in 2 weeks for suture removal and conversion to a  nonweightbearing cast.  Alfredo Martinez, PA-C was present and scrubbed for the duration of the case.  His assistance was essential in positioning the patient, prepping and draping, gaining and maintaining exposure, performing the operation, closing and dressing the wounds.  RADIOGRAPHS:  AP, mortise, and lateral radiographs of the left ankle were obtained intraoperatively.  These show interval reduction and fixation of lateral and posterior malleolus fractures.  The fractures and ankle mortise are appropriately aligned.  Hardware is appropriately positioned and of the appropriate length.     Toni Arthurs, MD     JH/MEDQ  D:  04/06/2016  T:  04/07/2016  Job:  [id number]

## 2016-05-25 ENCOUNTER — Encounter: Payer: Self-pay | Admitting: Physical Therapy

## 2016-05-25 ENCOUNTER — Ambulatory Visit: Payer: Medicaid Other | Attending: Orthopedic Surgery | Admitting: Physical Therapy

## 2016-05-25 DIAGNOSIS — R262 Difficulty in walking, not elsewhere classified: Secondary | ICD-10-CM | POA: Insufficient documentation

## 2016-05-25 DIAGNOSIS — M25572 Pain in left ankle and joints of left foot: Secondary | ICD-10-CM | POA: Diagnosis present

## 2016-05-25 DIAGNOSIS — S82842D Displaced bimalleolar fracture of left lower leg, subsequent encounter for closed fracture with routine healing: Secondary | ICD-10-CM | POA: Diagnosis not present

## 2016-05-25 DIAGNOSIS — M6281 Muscle weakness (generalized): Secondary | ICD-10-CM

## 2016-05-25 NOTE — Therapy (Signed)
[facility] [address] [place], [place], [address] Phone: [phone]   Fax:  [phone]  Physical Therapy Evaluation  Patient Details  Name: [name] [name] MRN: [id number] Date of Birth: 31-Dec-1985 Referring Provider: Toni Arthurs, MD  Encounter Date: 05/25/2016      PT End of Session - 05/25/16 1635    Visit Number 1   Authorization Type medicaid-awaiting authorization   PT Start Time 1634   PT Stop Time 1715   PT Time Calculation (min) 41 min   Activity Tolerance Patient tolerated treatment well   Behavior During Therapy [facility] for tasks assessed/performed      Past Medical History:  Diagnosis Date  . Anemia   . Asthma   . Headache    migraines  . Hx gestational diabetes   . Obesity   . PONV (postoperative nausea and vomiting)    after cholecystectomy    Past Surgical History:  Procedure Laterality Date  . arm surgery    . CERCLAGE LAPAROSCOPIC ABDOMINAL    . CERVICAL CERCLAGE    . CHOLECYSTECTOMY N/A 02/11/2016   Procedure: LAPAROSCOPIC CHOLECYSTECTOMY WITH INTRAOPERATIVE CHOLANGIOGRAM;  Surgeon: Abigail Miyamoto, MD;  Location: [facility] OR;  Service: General;  Laterality: N/A;  . ORIF ANKLE FRACTURE Left 04/06/2016   Procedure: OPEN REDUCTION INTERNAL FIXATION (ORIF) LEFT BIMALLEOLAR ANKLE FRACTURE WITH POSSIBLE DELTOID LIGAMENT REPAIR;  Surgeon: Toni Arthurs, MD;  Location: [facility] OR;  Service: Orthopedics;  Laterality: Left;  . TUBAL LIGATION      There were no vitals filed for this visit.       Subjective Assessment - 05/25/16 1639    Subjective Pt just had cast removed and placed in CAM boot yesterday. Has been placing some weight since then. Uses crutches and kneeling scooter.   Patient Stated Goals walk without limp   Currently in Pain? Yes   Pain Score 7    Pain Location Ankle   Pain Orientation Left   Pain Descriptors / Indicators Pressure;Sharp;Tightness;Numbness   Pain Type Surgical pain   Aggravating  Factors  pressure through foot, always hurts   Pain Relieving Factors elevation            [facility] PT Assessment - 05/25/16 0001      Assessment   Medical Diagnosis Bimalleolar fracture L ankle, closed, with routine healing   Referring Provider Toni Arthurs, MD   Onset Date/Surgical Date 04/06/16   Hand Dominance Right   Next MD Visit 10/6   Prior Therapy no     Precautions   Precautions None     Restrictions   Other Position/Activity Restrictions WBAT     Balance Screen   Has the patient fallen in the past 6 months Yes   How many times? 2     Home Environment   Living Environment Private residence   Living Arrangements Parent   Home Layout Two level     Prior Function   Level of Independence Independent     Cognition   Overall Cognitive Status Within Functional Limits for tasks assessed     Observation/Other Assessments   Skin Integrity pink wound just anterior to medial maleoulus; 1.5cm 10-4, .5cm 2-8; very dry skin that is flaking all around ankle and foot   Other Surveys  Other Surveys   Lower Extremity Functional Scale  19/80     ROM / Strength   AROM / PROM / Strength PROM;Strength     PROM   Overall PROM Comments minimal ROM  in all other directions   PROM Assessment Site Ankle   Right/Left Ankle Right   Right Ankle Dorsiflexion -6     Strength   Strength Assessment Site Ankle   Right/Left Ankle Right   Right Ankle Dorsiflexion 3-/5   Right Ankle Plantar Flexion 3/5   Right Ankle Inversion 3/5   Right Ankle Eversion 3/5     Palpation   Palpation comment no TTP     Ambulation/Gait   Assistive device --  kneeling scooter                   [facility] Adult PT Treatment/Exercise - 05/25/16 0001      Exercises   Exercises Ankle     Modalities   Modalities Vasopneumatic     Vasopneumatic   Number Minutes Vasopneumatic  15 minutes  5 min concurrent with HEP education   Vasopnuematic Location  Ankle  L   Vasopneumatic Pressure Low    Vasopneumatic Temperature  coldest     Ankle Exercises: Stretches   Gastroc Stretch Limitations long sitting DF stretch with towel     Ankle Exercises: Seated   Other Seated Ankle Exercises toe yoga                PT Education - 05/25/16 1719    Education provided Yes   Education Details anatomy of condition, POC, HEP, exercise form/rationale, skin care   Person(s) Educated Patient   Methods Explanation;Demonstration;Tactile cues;Verbal cues;Handout   Comprehension Verbalized understanding;Returned demonstration;Verbal cues required;Tactile cues required;Need further instruction          PT Short Term Goals - 05/25/16 1723      PT SHORT TERM GOAL #1   Title Pt will demo passive DF ROM to neutral by 9/29   Baseline -6 at eval   Time 3   Period Weeks   Status New     PT SHORT TERM GOAL #2   Title Pt will be able to ambulate 250ft without use of AD   Baseline unable at eval   Time 3   Period Weeks   Status New           PT Long Term Goals - 05/25/16 1725      PT LONG TERM GOAL #1   Title LEFS to at least 37/80 (9point MDC) to indicate significant improvement in functional activities by 10/20   Baseline 19/80 at eval   Time 6   Period Weeks   Status New     PT LONG TERM GOAL #2   Title Pt will be able to ascend/descend stairs step over step with use of handrail to navigate steps at home and in the community   Baseline unable at eval   Time 6   Period Weeks   Status New     PT LONG TERM GOAL #3   Title DF to 8 deg to demo necessary range for functional gait and stairs   Baseline -6 at eval   Time 6   Period Weeks   Status New     PT LONG TERM GOAL #4   Title Pt will be able to ambulate without need for AD to begin return to PLOF and age-appropriate activities   Baseline unable at eval   Time 6   Period Weeks   Status New               Plan - 05/25/16 1719    Clinical Impression Statement Pt presents to PT following  bimaleolar ankle  ORIF (CPT [phone]) that occurred in July and just had cast removed yesterday. Pt Had significant dryness and flaking of skin all over foot and ankle as well as sore (described in flowsheet) on ankle. Instructed to wash and lotion and not wear sock all the time. Pt will benefit from skilled PT in order to train gait pattern and return to functional activities without assistive device.    Rehab Potential Good   PT Frequency 2x / week  awaiting medicaid authorization   PT Treatment/Interventions ADLs/Self Care Home Management;Cryotherapy;Electrical Stimulation;Stair training;Gait training;Ultrasound;Moist Heat;Therapeutic activities;Therapeutic exercise;Balance training;Neuromuscular re-education;Patient/family education;Passive range of motion;Scar mobilization;Manual techniques;Dry needling;Taping;Vasopneumatic Device   PT Next Visit Plan gait & weight bearing, DF stretching   PT Home Exercise Plan DF stretch, toe yoga, weight bearing   Consulted and Agree with Plan of Care Patient      Patient will benefit from skilled therapeutic intervention in order to improve the following deficits and impairments:  Abnormal gait, Decreased range of motion, Difficulty walking, Pain, Decreased activity tolerance, Decreased scar mobility, Hypomobility, Impaired flexibility, Improper body mechanics, Increased edema, Decreased strength, Decreased mobility  Visit Diagnosis: Closed bimalleolar fracture of left ankle, with routine healing, subsequent encounter - Plan: PT plan of care cert/re-cert  Pain in left ankle and joints of left foot - Plan: PT plan of care cert/re-cert  Muscle weakness (generalized) - Plan: PT plan of care cert/re-cert  Difficulty in walking, not elsewhere classified - Plan: PT plan of care cert/re-cert     Problem List Patient Active Problem List   Diagnosis Date Noted  . Symptomatic cholelithiasis 02/11/2016  . Thrombocytopenia (HCC) 09/14/2014  . 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 Oda [place] PT, DPT 05/25/16 5:33 PM   [facility] [address] [place], [place], [address] Phone: [phone]   Fax:  [phone]  Name: [name] [name] MRN: [id number] Date of Birth: [birth year]

## 2016-06-07 ENCOUNTER — Encounter: Payer: Self-pay | Admitting: Physical Therapy

## 2016-06-07 ENCOUNTER — Ambulatory Visit: Payer: Medicaid Other | Admitting: Physical Therapy

## 2016-06-07 DIAGNOSIS — R262 Difficulty in walking, not elsewhere classified: Secondary | ICD-10-CM

## 2016-06-07 DIAGNOSIS — M25572 Pain in left ankle and joints of left foot: Secondary | ICD-10-CM

## 2016-06-07 DIAGNOSIS — M6281 Muscle weakness (generalized): Secondary | ICD-10-CM

## 2016-06-07 DIAGNOSIS — S82842D Displaced bimalleolar fracture of left lower leg, subsequent encounter for closed fracture with routine healing: Secondary | ICD-10-CM | POA: Diagnosis not present

## 2016-06-07 NOTE — Therapy (Signed)
[facility] [address] [place], [place], [address] Phone: [phone]   Fax:  [phone]  Physical Therapy Treatment  Patient Details  Name: [name] [name] MRN: [id number] Date of Birth: 11/08/1985 Referring Provider: Toni ArthursJohn Hewitt, MD  Encounter Date: 06/07/2016      PT End of Session - 06/07/16 1741    Visit Number 2   Number of Visits 4   Date for PT Re-Evaluation 06/21/16   Authorization Type medicaid auth 3 visits 05/31/09/4   PT Start Time 1547   PT Stop Time 1648   PT Time Calculation (min) 61 min   Activity Tolerance Patient tolerated treatment well;Patient limited by pain   Behavior [facility] for tasks assessed/performed      Past Medical History:  Diagnosis Date  . Anemia   . Asthma   . Headache    migraines  . Hx gestational diabetes   . Obesity   . PONV (postoperative nausea and vomiting)    after cholecystectomy    Past Surgical History:  Procedure Laterality Date  . arm surgery    . CERCLAGE LAPAROSCOPIC ABDOMINAL    . CERVICAL CERCLAGE    . CHOLECYSTECTOMY N/A 02/11/2016   Procedure: LAPAROSCOPIC CHOLECYSTECTOMY WITH INTRAOPERATIVE CHOLANGIOGRAM;  Surgeon: Abigail Miyamotoouglas Blackman, MD;  [facility];  Service: General;  Laterality: N/A;  . ORIF ANKLE FRACTURE Left 04/06/2016   Procedure: OPEN REDUCTION INTERNAL FIXATION (ORIF) LEFT BIMALLEOLAR ANKLE FRACTURE WITH POSSIBLE DELTOID LIGAMENT REPAIR;  Surgeon: Toni ArthursJohn Hewitt, MD;  Location: [facility] OR;  Service: Orthopedics;  Laterality: Left;  . TUBAL LIGATION      There were no vitals filed for this visit.      Subjective Assessment - 06/07/16 1548    Subjective Usually uses crutches around the house but uses the scooter if she goes out.  pain approx 8/10 when placing weight through foot.   Currently in Pain? No/denies            [facility] PT Assessment - 06/07/16 0001      PROM   Right Ankle Dorsiflexion -7  AROM -10                      [facility] Adult PT Treatment/Exercise - 06/07/16 0001      Vasopneumatic   Number Minutes Vasopneumatic  15 minutes   Vasopnuematic Location  Ankle   Vasopneumatic Pressure Low   Vasopneumatic Temperature  coldest     Manual Therapy   Manual therapy comments edema mobilization, DF stretching, joint mobs throughout foot     Ankle Exercises: Supine   Other Supine Ankle Exercises 4-way AROM elevated   Other Supine Ankle Exercises toe scrunches     Ankle Exercises: Stretches   Other Stretch seated HSS + gastroc with strap     Ankle Exercises: Standing   Other Standing Ankle Exercises standing weight shift in walker   Other Standing Ankle Exercises weight shift onto 4" step     Ankle Exercises: Aerobic   Stationary Bike 5 min no resistance     Ankle Exercises: Seated   BAPS Level 1;Sitting   Other Seated Ankle Exercises rocker board A/P  cues for foot alignment                PT Education - 06/07/16 1740    Education provided Yes   Education Details exercise form/rationale, DF ROM, progression of ambulation, ASO/shoe   Person(s) Educated Patient   Methods Explanation;Demonstration;Tactile cues;Verbal  cues   Comprehension Verbalized understanding;Verbal cues required;Returned demonstration;Tactile cues required;Need further instruction          PT Short Term Goals - 05/25/16 1723      PT SHORT TERM GOAL #1   Title Pt will demo passive DF ROM to neutral by 9/29   Baseline -6 at eval   Time 3   Period Weeks   Status New     PT SHORT TERM GOAL #2   Title Pt will be able to ambulate 70ft without use of AD   Baseline unable at eval   Time 3   Period Weeks   Status New           PT Long Term Goals - 05/25/16 1725      PT LONG TERM GOAL #1   Title LEFS to at least 37/80 (9point MDC) to indicate significant improvement in functional activities by 10/20   Baseline 19/80 at eval   Time 6   Period Weeks   Status New     PT  LONG TERM GOAL #2   Title Pt will be able to ascend/descend stairs step over step with use of handrail to navigate steps at home and in the community   Baseline unable at eval   Time 6   Period Weeks   Status New     PT LONG TERM GOAL #3   Title DF to 8 deg to demo necessary range for functional gait and stairs   Baseline -6 at eval   Time 6   Period Weeks   Status New     PT LONG TERM GOAL #4   Title Pt will be able to ambulate without need for AD to begin return to PLOF and age-appropriate activities   Baseline unable at eval   Time 6   Period Weeks   Status New               Plan - 06/07/16 1612    Clinical Impression Statement Pt demo ability to place weight into foot while holding walker, some discomfort and hip flexion to compensate for lack of ROM. Improved mobility noted following treatment and reported feeling a little better. Pt instruced to wear ASO and tennis shoe rather than boot and to use crutches to gradually begin placing more weight through foot.    PT Next Visit Plan gait & weight bearing, DF stretching   Consulted and Agree with Plan of Care Patient      Patient will benefit from skilled therapeutic intervention in order to improve the following deficits and impairments:     Visit Diagnosis: Pain in left ankle and joints of left foot  Muscle weakness (generalized)  Difficulty in walking, not elsewhere classified     Problem List Patient Active Problem List   Diagnosis Date Noted  . Symptomatic cholelithiasis 02/11/2016  . Thrombocytopenia (HCC) 09/14/2014  . 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 [place], [place], [address] Phone: [phone]   Fax:  [phone]  Name: [name] [name] MRN: [id number] Date of Birth: Aug 14, 1986

## 2016-06-13 ENCOUNTER — Ambulatory Visit: Payer: Medicaid Other | Admitting: Physical Therapy

## 2016-06-13 DIAGNOSIS — R262 Difficulty in walking, not elsewhere classified: Secondary | ICD-10-CM

## 2016-06-13 DIAGNOSIS — S82842D Displaced bimalleolar fracture of left lower leg, subsequent encounter for closed fracture with routine healing: Secondary | ICD-10-CM | POA: Diagnosis not present

## 2016-06-13 DIAGNOSIS — M25572 Pain in left ankle and joints of left foot: Secondary | ICD-10-CM

## 2016-06-13 DIAGNOSIS — M6281 Muscle weakness (generalized): Secondary | ICD-10-CM

## 2016-06-14 NOTE — Therapy (Signed)
[facility] [address] [place], [place], [address] Phone: [phone]   Fax:  [phone]  Physical Therapy Treatment  Patient Details  Name: [name] MelnickKeira M Mcmannis MRN: [id number] Date of Birth: 04-Jun-1986 Referring Provider: Toni ArthursJohn Hewitt, MD  Encounter Date: 06/13/2016      PT End of Session - 06/13/16 0822    Visit Number 3   Number of Visits 4   Date for PT Re-Evaluation 06/21/16   Authorization Type medicaid auth 3 visits 05/31/09/4   PT Start Time 0315  15 minutes   PT Stop Time 0405   PT Time Calculation (min) 50 min      Past Medical History:  Diagnosis Date  . Anemia   . Asthma   . Headache    migraines  . Hx gestational diabetes   . Obesity   . PONV (postoperative nausea and vomiting)    after cholecystectomy    Past Surgical History:  Procedure Laterality Date  . arm surgery    . CERCLAGE LAPAROSCOPIC ABDOMINAL    . CERVICAL CERCLAGE    . CHOLECYSTECTOMY N/A 02/11/2016   Procedure: LAPAROSCOPIC CHOLECYSTECTOMY WITH INTRAOPERATIVE CHOLANGIOGRAM;  Surgeon: Abigail Miyamotoouglas Blackman, MD;  [facility];  Service: General;  Laterality: N/A;  . ORIF ANKLE FRACTURE Left 04/06/2016   Procedure: OPEN REDUCTION INTERNAL FIXATION (ORIF) LEFT BIMALLEOLAR ANKLE FRACTURE WITH POSSIBLE DELTOID LIGAMENT REPAIR;  Surgeon: Toni ArthursJohn Hewitt, MD;  Location: [facility] OR;  Service: Orthopedics;  Laterality: Left;  . TUBAL LIGATION      There were no vitals filed for this visit.      Subjective Assessment - 06/13/16 1517    Currently in Pain? Yes   Pain Score 6    Pain Location Ankle   Pain Orientation Posterior   Aggravating Factors  DF                         [facility] Adult PT Treatment/Exercise - 06/14/16 0001      Ambulation/Gait   Assistive device Crutches   Gait Pattern Step-to pattern   Pre-Gait Activities standing with crutches and weight shifting R- L 10 sec at a time, attmepts at standing with trunk extended  causes increased pull in heel and ankle due to lack of DF     Vasopneumatic   Number Minutes Vasopneumatic  15 minutes   Vasopnuematic Location  Ankle   Vasopneumatic Pressure Low   Vasopneumatic Temperature  coldest     Manual Therapy   Manual therapy comments Ankle mobs and PROM all planes                  PT Short Term Goals - 05/25/16 1723      PT SHORT TERM GOAL #1   Title Pt will demo passive DF ROM to neutral by 9/29   Baseline -6 at eval   Time 3   Period Weeks   Status New     PT SHORT TERM GOAL #2   Title Pt will be able to ambulate 7650ft without use of AD   Baseline unable at eval   Time 3   Period Weeks   Status New           PT Long Term Goals - 05/25/16 1725      PT LONG TERM GOAL #1   Title LEFS to at least 37/80 (9point MDC) to indicate significant improvement in functional activities by 10/20   Baseline 19/80 at eval  Time 6   Period Weeks   Status New     PT LONG TERM GOAL #2   Title Pt will be able to ascend/descend stairs step over step with use of handrail to navigate steps at home and in the community   Baseline unable at eval   Time 6   Period Weeks   Status New     PT LONG TERM GOAL #3   Title DF to 8 deg to demo necessary range for functional gait and stairs   Baseline -6 at eval   Time 6   Period Weeks   Status New     PT LONG TERM GOAL #4   Title Pt will be able to ambulate without need for AD to begin return to PLOF and age-appropriate activities   Baseline unable at eval   Time 6   Period Weeks   Status New               Plan - 06/14/16 9604    Clinical Impression Statement Pt enters 15 minutes late with bilateral crutches and NWB to left LE. She reports she is trying to bear weight when she gets to stopping points. She is unable to find shoe that fits due to swelling. Asked her to try her sneaker without laces and bring it next time so we can work more on weight bearing/ gait. Mobs helpful however still  lacks DF for standing. Pt unsure if Vaso is helpful. Pt given info on contrast baths.    PT Next Visit Plan gait & weight bearing, DF stretching; edema management. ; did she try contrast bath?       Patient will benefit from skilled therapeutic intervention in order to improve the following deficits and impairments:  Abnormal gait, Decreased range of motion, Difficulty walking, Pain, Decreased activity tolerance, Decreased scar mobility, Hypomobility, Impaired flexibility, Improper body mechanics, Increased edema, Decreased strength, Decreased mobility  Visit Diagnosis: Pain in left ankle and joints of left foot  Muscle weakness (generalized)  Difficulty in walking, not elsewhere classified  Closed bimalleolar fracture of left ankle, with routine healing, subsequent encounter     Problem List Patient Active Problem List   Diagnosis Date Noted  . Symptomatic cholelithiasis 02/11/2016  . Thrombocytopenia (HCC) 09/14/2014  . 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

## 2016-06-15 ENCOUNTER — Encounter: Payer: Medicaid Other | Admitting: Physical Therapy

## 2016-06-20 ENCOUNTER — Ambulatory Visit: Payer: Medicaid Other | Attending: Orthopedic Surgery | Admitting: Physical Therapy

## 2016-06-20 ENCOUNTER — Encounter: Payer: Self-pay | Admitting: Physical Therapy

## 2016-06-20 DIAGNOSIS — M6281 Muscle weakness (generalized): Secondary | ICD-10-CM | POA: Diagnosis present

## 2016-06-20 DIAGNOSIS — M25572 Pain in left ankle and joints of left foot: Secondary | ICD-10-CM | POA: Diagnosis not present

## 2016-06-20 NOTE — Therapy (Addendum)
[facility] [address] [place], [place], [address] Phone: [phone]   Fax:  [phone]  Physical Therapy Treatment/Discharge Summary  Patient Details  Name: [name] [name] MRN: [id number] Date of Birth: 12/18/1985 Referring Provider: John Hewitt, MD  Encounter Date: 06/20/2016      PT End of Session - 06/20/16 1612    Visit Number 4   Number of Visits 4   Date for PT Re-Evaluation 06/21/16   Authorization Type medicaid auth 3 visits 05/31/09/4   PT Start Time 1615   PT Stop Time 1713   PT Time Calculation (min) 58 min   Activity Tolerance Patient tolerated treatment well;Patient limited by pain   Behavior During Therapy WFL for tasks assessed/performed      Past Medical History:  Diagnosis Date  . Anemia   . Asthma   . Headache    migraines  . Hx gestational diabetes   . Obesity   . PONV (postoperative nausea and vomiting)    after cholecystectomy    Past Surgical History:  Procedure Laterality Date  . arm surgery    . CERCLAGE LAPAROSCOPIC ABDOMINAL    . CERVICAL CERCLAGE    . CHOLECYSTECTOMY N/A 02/11/2016   Procedure: LAPAROSCOPIC CHOLECYSTECTOMY WITH INTRAOPERATIVE CHOLANGIOGRAM;  Surgeon: Douglas Blackman, MD;  Location: [facility] OR;  Service: General;  Laterality: N/A;  . ORIF ANKLE FRACTURE Left 04/06/2016   Procedure: OPEN REDUCTION INTERNAL FIXATION (ORIF) LEFT BIMALLEOLAR ANKLE FRACTURE WITH POSSIBLE DELTOID LIGAMENT REPAIR;  Surgeon: John Hewitt, MD;  Location: [facility] OR;  Service: Orthopedics;  Laterality: Left;  . TUBAL LIGATION      There were no vitals filed for this visit.      Subjective Assessment - 06/20/16 1613    Subjective Pt continues to report tightness in ankle. Is walking around home without crutches   Patient Stated Goals walk without limp   Currently in Pain? Yes   Pain Location Ankle   Pain Orientation Posterior   Pain Descriptors / Indicators Tightness   Aggravating Factors  walking    Pain Relieving Factors stretching            [facility] PT Assessment - 06/20/16 0001      Observation/Other Assessments   Lower Extremity Functional Scale  37/80     PROM   Right Ankle Dorsiflexion 0     Strength   Right Ankle Dorsiflexion 3+/5   Right Ankle Plantar Flexion 3/5  unable to perform standing PF   Right Ankle Inversion 4/5   Right Ankle Eversion 4+/5                     [facility] Adult PT Treatment/Exercise - 06/20/16 0001      Ambulation/Gait   Gait Comments gait training: with 2 and single axillary crutches, in parallel bars     Modalities   Modalities Cryotherapy     Cryotherapy   Number Minutes Cryotherapy 10 Minutes   Cryotherapy Location Ankle   Type of Cryotherapy Ice pack     Manual Therapy   Manual therapy comments DF stretching, A/P talocurural mobs     Ankle Exercises: Stretches   Soleus Stretch 2 reps;30 seconds  long sitting with strap   Gastroc Stretch 2 reps;30 seconds   Gastroc Stretch Limitations slant board     Ankle Exercises: Standing   Side Shuffle (Round Trip) x4 in parallel bars   Other Standing Ankle Exercises A/P weight shift with active DF     Other Standing Ankle Exercises lateral weight shifts                PT Education - 06/20/16 1612    Education provided Yes   Education Details exercise form/rationale, HEP   Person(s) Educated Patient   Methods Explanation;Demonstration;Tactile cues;Verbal cues   Comprehension Verbalized understanding;Returned demonstration;Verbal cues required;Tactile cues required;Need further instruction          PT Short Term Goals - 06/20/16 1658      PT SHORT TERM GOAL #1   Title Pt will demo passive DF ROM to neutral by 9/29   Status Achieved     PT SHORT TERM GOAL #2   Title Pt will be able to ambulate 57f without use of AD   Baseline able with antalgic pattern    Status Achieved           PT Long Term Goals - 06/20/16 1658      PT LONG TERM GOAL #1    Title LEFS to at least 37/80 (9point MDC) to indicate significant improvement in functional activities by 10/20   Baseline 37/80 on 10/3   Status Achieved     PT LONG TERM GOAL #2   Title Pt will be able to ascend/descend stairs step over step with use of handrail to navigate steps at home and in the community   Status Not Met     PT LONG TERM GOAL #3   Title DF to 8 deg to demo necessary range for functional gait and stairs   Status Not Met     PT LONG TERM GOAL #4   Title Pt will be able to ambulate without need for AD to begin return to PLOF and age-appropriate activities   Status Not Met               Plan - 06/20/16 1659    Clinical Impression Statement Heavy cuing for gait pattern with crutches today. Pt able to ambulate with more appropriate pattern using one crutch and without any than she did with bilateral crutches. Was educated on importance of walking and placing weight through ankle. Continues to lack necessary DF ROM to demonstrate appropraite stance phase and toe off in gait pattern. Was able to obtain DF to neutral with stretching and mobilizations today. Pt will contact [facility] about further treatment due to finacial issues- discussed options for self pay, [facility]    Consulted and Agree with Plan of Care Patient      Patient will benefit from skilled therapeutic intervention in order to improve the following deficits and impairments:     Visit Diagnosis: Pain in left ankle and joints of left foot  Muscle weakness (generalized)     Problem List Patient Active Problem List   Diagnosis Date Noted  . Symptomatic cholelithiasis 02/11/2016  . [place]  . [place]rombocytopenia, primary (congenital) ([place] 09/14/2014    Jeni Duling C. Ariadna Setter PT, DPT 06/20/16 5:16 PM   [facility] [address]Phone: [phone]  Fax:   [phone] Name: [name] SHISLERMRN: [id number] of Birth: 906-27-1987 PHYSICAL THERAPY DISCHARGE SUMMARY  Visits from Start of Care: 4  Current functional level related to goals / functional outcomes: See above   Remaining deficits: See above   Education / Equipment: Anatomy of condition, POC, HEP, exercise form/rationale  Plan: Patient agrees to discharge.  Patient goals were partially met. Patient  is being discharged due to financial reasons.  ?????    Pt limited by medicaid allowance, now returning for surgical intervention.   C.  PT, DPT 08/03/16 3:41 PM      

## 2016-06-22 ENCOUNTER — Ambulatory Visit: Payer: Medicaid Other | Admitting: Physical Therapy

## 2016-06-27 ENCOUNTER — Encounter: Payer: Medicaid Other | Admitting: Physical Therapy

## 2016-06-29 ENCOUNTER — Encounter: Payer: Medicaid Other | Admitting: Physical Therapy

## 2016-07-26 ENCOUNTER — Other Ambulatory Visit: Payer: Self-pay | Admitting: Orthopedic Surgery

## 2016-08-18 DIAGNOSIS — Z969 Presence of functional implant, unspecified: Secondary | ICD-10-CM

## 2016-08-18 HISTORY — DX: Presence of functional implant, unspecified: Z96.9

## 2016-08-24 ENCOUNTER — Encounter (HOSPITAL_BASED_OUTPATIENT_CLINIC_OR_DEPARTMENT_OTHER): Payer: Self-pay | Admitting: *Deleted

## 2016-08-24 NOTE — Pre-Procedure Instructions (Signed)
Discussed history of thrombocytopenia with Dr. Aleene DavidsonE. Fitzgerald; check CBC prior to surgery.  Pt. notified to come in for labs.

## 2016-08-25 ENCOUNTER — Encounter (HOSPITAL_BASED_OUTPATIENT_CLINIC_OR_DEPARTMENT_OTHER)
Admission: RE | Admit: 2016-08-25 | Discharge: 2016-08-25 | Disposition: A | Discharge status: Home / Self Care | Payer: Medicaid Other | Source: Ambulatory Visit | Attending: Orthopedic Surgery | Admitting: Orthopedic Surgery

## 2016-08-25 DIAGNOSIS — D696 Thrombocytopenia, unspecified: Secondary | ICD-10-CM | POA: Diagnosis not present

## 2016-08-25 DIAGNOSIS — Z01812 Encounter for preprocedural laboratory examination: Secondary | ICD-10-CM | POA: Diagnosis not present

## 2016-08-25 DIAGNOSIS — D649 Anemia, unspecified: Secondary | ICD-10-CM | POA: Insufficient documentation

## 2016-08-25 LAB — CBC
HEMATOCRIT: 35.1 % — AB (ref 36.0–46.0)
HEMOGLOBIN: 11.8 g/dL — AB (ref 12.0–15.0)
MCH: 26 pg (ref 26.0–34.0)
MCHC: 33.6 g/dL (ref 30.0–36.0)
MCV: 77.5 fL — ABNORMAL LOW (ref 78.0–100.0)
Platelets: 84 10*3/uL — ABNORMAL LOW (ref 150–400)
RBC: 4.53 MIL/uL (ref 3.87–5.11)
RDW: 15.5 % (ref 11.5–15.5)
WBC: 7.2 10*3/uL (ref 4.0–10.5)

## 2016-08-28 NOTE — Progress Notes (Signed)
Abnormal platelet count reviewed by Dr Randa EvensEdwards, instruction to contact dr hewitt.  Pt needs to be cleared by medical doctor before surgery can be done at center. Called Dr Victorino DikeHewitt waiting for return call

## 2016-08-28 NOTE — Progress Notes (Signed)
Spoke with velvet she will talk with dr Victorino Dikehewitt and will advise what is being done.

## 2016-08-31 ENCOUNTER — Encounter (HOSPITAL_BASED_OUTPATIENT_CLINIC_OR_DEPARTMENT_OTHER)
Admission: RE | Disposition: A | Discharge status: Home / Self Care | Payer: Self-pay | Source: Ambulatory Visit | Attending: Orthopedic Surgery

## 2016-08-31 ENCOUNTER — Ambulatory Visit (HOSPITAL_BASED_OUTPATIENT_CLINIC_OR_DEPARTMENT_OTHER): Payer: Medicaid Other | Admitting: Certified Registered"

## 2016-08-31 ENCOUNTER — Encounter (HOSPITAL_BASED_OUTPATIENT_CLINIC_OR_DEPARTMENT_OTHER): Payer: Self-pay | Admitting: Certified Registered"

## 2016-08-31 ENCOUNTER — Ambulatory Visit (HOSPITAL_BASED_OUTPATIENT_CLINIC_OR_DEPARTMENT_OTHER)
Admission: RE | Admit: 2016-08-31 | Discharge: 2016-08-31 | Disposition: A | Discharge status: Home / Self Care | Payer: Medicaid Other | Source: Ambulatory Visit | Attending: Orthopedic Surgery | Admitting: Orthopedic Surgery

## 2016-08-31 DIAGNOSIS — Z472 Encounter for removal of internal fixation device: Secondary | ICD-10-CM | POA: Diagnosis not present

## 2016-08-31 DIAGNOSIS — Z6841 Body Mass Index (BMI) 40.0 and over, adult: Secondary | ICD-10-CM | POA: Diagnosis not present

## 2016-08-31 DIAGNOSIS — D649 Anemia, unspecified: Secondary | ICD-10-CM | POA: Insufficient documentation

## 2016-08-31 DIAGNOSIS — M25572 Pain in left ankle and joints of left foot: Secondary | ICD-10-CM | POA: Diagnosis present

## 2016-08-31 DIAGNOSIS — Z9889 Other specified postprocedural states: Secondary | ICD-10-CM

## 2016-08-31 HISTORY — DX: Presence of functional implant, unspecified: Z96.9

## 2016-08-31 HISTORY — DX: Unspecified asthma, uncomplicated: J45.909

## 2016-08-31 HISTORY — PX: HARDWARE REMOVAL: SHX979

## 2016-08-31 HISTORY — DX: Thrombocytopenia, unspecified: D69.6

## 2016-08-31 SURGERY — REMOVAL, HARDWARE
Anesthesia: General | Site: Foot | Laterality: Left

## 2016-08-31 MED ORDER — ONDANSETRON HCL 4 MG/2ML IJ SOLN
INTRAMUSCULAR | Status: AC
  Filled 2016-08-31: qty 2

## 2016-08-31 MED ORDER — DEXAMETHASONE SODIUM PHOSPHATE 10 MG/ML IJ SOLN
INTRAMUSCULAR | Status: AC
  Filled 2016-08-31: qty 3

## 2016-08-31 MED ORDER — LACTATED RINGERS IV SOLN
INTRAVENOUS | Status: DC
  Administered 2016-08-31: 09:00:00 via INTRAVENOUS

## 2016-08-31 MED ORDER — LIDOCAINE HCL (CARDIAC) 20 MG/ML IV SOLN
INTRAVENOUS | Status: DC | PRN
  Administered 2016-08-31: 60 mg via INTRAVENOUS

## 2016-08-31 MED ORDER — FENTANYL CITRATE (PF) 100 MCG/2ML IJ SOLN
INTRAMUSCULAR | Status: AC
  Filled 2016-08-31: qty 2

## 2016-08-31 MED ORDER — OXYCODONE HCL 5 MG PO TABS: 5.0000 mg | ORAL_TABLET | ORAL | 0 refills | Status: DC | PRN

## 2016-08-31 MED ORDER — DEXAMETHASONE SODIUM PHOSPHATE 10 MG/ML IJ SOLN
INTRAMUSCULAR | Status: DC | PRN
  Administered 2016-08-31: 10 mg via INTRAVENOUS

## 2016-08-31 MED ORDER — ONDANSETRON HCL 4 MG/2ML IJ SOLN
INTRAMUSCULAR | Status: AC
  Filled 2016-08-31: qty 10

## 2016-08-31 MED ORDER — HYDROMORPHONE HCL 1 MG/ML IJ SOLN
0.2500 mg | INTRAMUSCULAR | Status: DC | PRN
  Administered 2016-08-31 (×2): 0.5 mg via INTRAVENOUS

## 2016-08-31 MED ORDER — CEFAZOLIN SODIUM-DEXTROSE 2-4 GM/100ML-% IV SOLN
INTRAVENOUS | Status: AC
  Filled 2016-08-31: qty 100

## 2016-08-31 MED ORDER — SODIUM CHLORIDE 0.9 % IV SOLN: INTRAVENOUS | Status: DC

## 2016-08-31 MED ORDER — PROPOFOL 500 MG/50ML IV EMUL
INTRAVENOUS | Status: AC
  Filled 2016-08-31: qty 50

## 2016-08-31 MED ORDER — LIDOCAINE 2% (20 MG/ML) 5 ML SYRINGE
INTRAMUSCULAR | Status: AC
  Filled 2016-08-31: qty 5

## 2016-08-31 MED ORDER — CEFAZOLIN SODIUM-DEXTROSE 2-4 GM/100ML-% IV SOLN
2.0000 g | INTRAVENOUS | Status: AC
  Administered 2016-08-31: 2 g via INTRAVENOUS

## 2016-08-31 MED ORDER — FENTANYL CITRATE (PF) 100 MCG/2ML IJ SOLN
50.0000 ug | INTRAMUSCULAR | Status: DC | PRN
  Administered 2016-08-31: 100 ug via INTRAVENOUS
  Administered 2016-08-31: 25 ug via INTRAVENOUS

## 2016-08-31 MED ORDER — HYDROMORPHONE HCL 1 MG/ML IJ SOLN
INTRAMUSCULAR | Status: AC
  Filled 2016-08-31: qty 1

## 2016-08-31 MED ORDER — LIDOCAINE 2% (20 MG/ML) 5 ML SYRINGE
INTRAMUSCULAR | Status: AC
  Filled 2016-08-31: qty 10

## 2016-08-31 MED ORDER — DEXAMETHASONE SODIUM PHOSPHATE 10 MG/ML IJ SOLN
INTRAMUSCULAR | Status: AC
  Filled 2016-08-31: qty 1

## 2016-08-31 MED ORDER — PROPOFOL 10 MG/ML IV BOLUS
INTRAVENOUS | Status: DC | PRN
  Administered 2016-08-31: 200 mg via INTRAVENOUS

## 2016-08-31 MED ORDER — ROCURONIUM BROMIDE 10 MG/ML (PF) SYRINGE
PREFILLED_SYRINGE | INTRAVENOUS | Status: AC
  Filled 2016-08-31: qty 20

## 2016-08-31 MED ORDER — CHLORHEXIDINE GLUCONATE 4 % EX LIQD: 60.0000 mL | Freq: Once | CUTANEOUS | Status: DC

## 2016-08-31 MED ORDER — BUPIVACAINE-EPINEPHRINE (PF) 0.25% -1:200000 IJ SOLN
INTRAMUSCULAR | Status: DC | PRN
  Administered 2016-08-31: 16 mL via PERINEURAL

## 2016-08-31 MED ORDER — DOCUSATE SODIUM 100 MG PO CAPS: 100.0000 mg | ORAL_CAPSULE | Freq: Two times a day (BID) | ORAL | 0 refills | Status: DC

## 2016-08-31 MED ORDER — ONDANSETRON HCL 4 MG/2ML IJ SOLN
INTRAMUSCULAR | Status: DC | PRN
  Administered 2016-08-31: 4 mg via INTRAVENOUS

## 2016-08-31 MED ORDER — SENNA 8.6 MG PO TABS: 2.0000 | ORAL_TABLET | Freq: Two times a day (BID) | ORAL | 0 refills | Status: DC

## 2016-08-31 MED ORDER — ONDANSETRON HCL 4 MG/2ML IJ SOLN
INTRAMUSCULAR | Status: AC
  Filled 2016-08-31: qty 8

## 2016-08-31 MED ORDER — EPHEDRINE 5 MG/ML INJ
INTRAVENOUS | Status: AC
  Filled 2016-08-31: qty 30

## 2016-08-31 MED ORDER — SCOPOLAMINE 1 MG/3DAYS TD PT72: 1.0000 | MEDICATED_PATCH | Freq: Once | TRANSDERMAL | Status: DC | PRN

## 2016-08-31 MED ORDER — MIDAZOLAM HCL 2 MG/2ML IJ SOLN
INTRAMUSCULAR | Status: AC
  Filled 2016-08-31: qty 2

## 2016-08-31 MED ORDER — EPHEDRINE 5 MG/ML INJ
INTRAVENOUS | Status: AC
  Filled 2016-08-31: qty 10

## 2016-08-31 MED ORDER — PROMETHAZINE HCL 25 MG/ML IJ SOLN
6.2500 mg | INTRAMUSCULAR | Status: DC | PRN
Start: 2016-08-31 — End: 2016-08-31

## 2016-08-31 MED ORDER — MIDAZOLAM HCL 2 MG/2ML IJ SOLN
1.0000 mg | INTRAMUSCULAR | Status: DC | PRN
  Administered 2016-08-31: 2 mg via INTRAVENOUS

## 2016-08-31 SURGICAL SUPPLY — 62 items
BANDAGE ACE 4X5 VEL STRL LF (GAUZE/BANDAGES/DRESSINGS) IMPLANT
BANDAGE ESMARK 6X9 LF (GAUZE/BANDAGES/DRESSINGS) ×1 IMPLANT
BLADE SURG 15 STRL LF DISP TIS (BLADE) ×2 IMPLANT
BLADE SURG 15 STRL SS (BLADE) ×4
BNDG COHESIVE 4X5 TAN STRL (GAUZE/BANDAGES/DRESSINGS) ×3 IMPLANT
BNDG COHESIVE 6X5 TAN STRL LF (GAUZE/BANDAGES/DRESSINGS) IMPLANT
BNDG ESMARK 4X9 LF (GAUZE/BANDAGES/DRESSINGS) IMPLANT
BNDG ESMARK 6X9 LF (GAUZE/BANDAGES/DRESSINGS) ×3
CHLORAPREP W/TINT 26ML (MISCELLANEOUS) ×3 IMPLANT
COVER BACK TABLE 60X90IN (DRAPES) ×3 IMPLANT
CUFF TOURNIQUET SINGLE 34IN LL (TOURNIQUET CUFF) IMPLANT
CUFF TOURNIQUET SINGLE 44IN (TOURNIQUET CUFF) IMPLANT
DECANTER SPIKE VIAL GLASS SM (MISCELLANEOUS) IMPLANT
DRAPE EXTREMITY T 121X128X90 (DRAPE) ×3 IMPLANT
DRAPE OEC MINIVIEW 54X84 (DRAPES) ×3 IMPLANT
DRAPE SURG 17X23 STRL (DRAPES) IMPLANT
DRAPE U-SHAPE 47X51 STRL (DRAPES) ×3 IMPLANT
DRSG MEPITEL 4X7.2 (GAUZE/BANDAGES/DRESSINGS) ×3 IMPLANT
DRSG PAD ABDOMINAL 8X10 ST (GAUZE/BANDAGES/DRESSINGS) ×3 IMPLANT
ELECT REM PT RETURN 9FT ADLT (ELECTROSURGICAL) ×3
ELECTRODE REM PT RTRN 9FT ADLT (ELECTROSURGICAL) ×1 IMPLANT
GAUZE SPONGE 4X4 12PLY STRL (GAUZE/BANDAGES/DRESSINGS) ×3 IMPLANT
GLOVE BIO SURGEON STRL SZ8 (GLOVE) ×3 IMPLANT
GLOVE BIOGEL PI IND STRL 7.0 (GLOVE) ×1 IMPLANT
GLOVE BIOGEL PI IND STRL 8 (GLOVE) ×2 IMPLANT
GLOVE BIOGEL PI INDICATOR 7.0 (GLOVE) ×2
GLOVE BIOGEL PI INDICATOR 8 (GLOVE) ×4
GLOVE ECLIPSE 6.5 STRL STRAW (GLOVE) ×3 IMPLANT
GLOVE ECLIPSE 7.5 STRL STRAW (GLOVE) ×3 IMPLANT
GOWN STRL REUS W/ TWL LRG LVL3 (GOWN DISPOSABLE) ×1 IMPLANT
GOWN STRL REUS W/ TWL XL LVL3 (GOWN DISPOSABLE) ×2 IMPLANT
GOWN STRL REUS W/TWL LRG LVL3 (GOWN DISPOSABLE) ×2
GOWN STRL REUS W/TWL XL LVL3 (GOWN DISPOSABLE) ×4
NEEDLE HYPO 22GX1.5 SAFETY (NEEDLE) IMPLANT
PACK BASIN DAY SURGERY FS (CUSTOM PROCEDURE TRAY) ×3 IMPLANT
PAD CAST 4YDX4 CTTN HI CHSV (CAST SUPPLIES) ×1 IMPLANT
PADDING CAST ABS 4INX4YD NS (CAST SUPPLIES)
PADDING CAST ABS COTTON 4X4 ST (CAST SUPPLIES) IMPLANT
PADDING CAST COTTON 4X4 STRL (CAST SUPPLIES) ×2
PADDING CAST COTTON 6X4 STRL (CAST SUPPLIES) IMPLANT
PENCIL BUTTON HOLSTER BLD 10FT (ELECTRODE) ×3 IMPLANT
SANITIZER HAND PURELL 535ML FO (MISCELLANEOUS) ×3 IMPLANT
SHEET MEDIUM DRAPE 40X70 STRL (DRAPES) ×3 IMPLANT
SLEEVE SCD COMPRESS KNEE MED (MISCELLANEOUS) ×3 IMPLANT
SPLINT FAST PLASTER 5X30 (CAST SUPPLIES)
SPLINT PLASTER CAST FAST 5X30 (CAST SUPPLIES) IMPLANT
SPONGE LAP 18X18 X RAY DECT (DISPOSABLE) ×3 IMPLANT
STOCKINETTE 6  STRL (DRAPES) ×2
STOCKINETTE 6 STRL (DRAPES) ×1 IMPLANT
SUCTION FRAZIER HANDLE 10FR (MISCELLANEOUS) ×2
SUCTION TUBE FRAZIER 10FR DISP (MISCELLANEOUS) ×1 IMPLANT
SUT ETHILON 3 0 PS 1 (SUTURE) ×3 IMPLANT
SUT MNCRL AB 3-0 PS2 18 (SUTURE) ×3 IMPLANT
SUT VIC AB 0 SH 27 (SUTURE) IMPLANT
SUT VIC AB 2-0 SH 27 (SUTURE)
SUT VIC AB 2-0 SH 27XBRD (SUTURE) IMPLANT
SYR BULB 3OZ (MISCELLANEOUS) ×3 IMPLANT
SYR CONTROL 10ML LL (SYRINGE) ×3 IMPLANT
TOWEL OR 17X24 6PK STRL BLUE (TOWEL DISPOSABLE) ×3 IMPLANT
TUBE CONNECTING 20'X1/4 (TUBING) ×1
TUBE CONNECTING 20X1/4 (TUBING) ×2 IMPLANT
UNDERPAD 30X30 (UNDERPADS AND DIAPERS) ×3 IMPLANT

## 2016-08-31 NOTE — Brief Op Note (Signed)
08/31/2016  10:57 AM  PATIENT:  [name] [name]  30 y.o. female  PRE-OPERATIVE DIAGNOSIS:  LEFT ANKLE RETAINED HARDWARE - tibia and fibula  POST-OPERATIVE DIAGNOSIS:  same  Procedure(s): 1.  Left fibula removal of deep implants 2.  Left tibia removal of deep implants (separate incision) 3.  Left ankle AP and lateral xrays  SURGEON:  Toni ArthursJohn Clois Treanor, MD  ASSISTANT: Alfredo MartinezJustin Ollis, PA-C  ANESTHESIA:   General, regional  EBL:  minimal   TOURNIQUET:  < 30 min with an ankle esmarch  COMPLICATIONS:  None apparent  DISPOSITION:  Extubated, awake and stable to recovery.  DICTATION ID:  [id number]

## 2016-08-31 NOTE — Anesthesia Postprocedure Evaluation (Addendum)
Anesthesia Post Note  [name]  Procedure(s) Performed: Procedure(s) (LRB): LEFT ANKLE REMOVAL OF DEEP IMPLANTS (Left)  Patient location during evaluation: PACU Anesthesia Type: General Level of consciousness: sedated Pain management: pain level controlled Vital Signs Assessment: post-procedure vital signs reviewed and stable Respiratory status: spontaneous breathing and respiratory function stable Cardiovascular status: stable Anesthetic complications: no    Last Vitals:  Vitals:   08/31/16 1145 08/31/16 1200  BP: (!) 143/86   Pulse: 64 69  Resp: 17 17  Temp:                    Caitlen Worth DANIEL

## 2016-08-31 NOTE — Anesthesia Preprocedure Evaluation (Addendum)
Anesthesia Evaluation  Patient identified by MRN, date of birth, ID band Patient awake    Reviewed: Allergy & Precautions, Patient's Chart, lab work & pertinent test results  History of Anesthesia Complications Negative for: history of anesthetic complications  Airway Mallampati: II  TM Distance: >3 FB Neck ROM: Full    Dental no notable dental hx. (+) Dental Advisory Given   Pulmonary asthma ,    Pulmonary exam normal        Cardiovascular negative cardio ROS Normal cardiovascular exam     Neuro/Psych negative neurological ROS  negative psych ROS   GI/Hepatic negative GI ROS, Neg liver ROS,   Endo/Other  Morbid obesity  Renal/GU negative Renal ROS     Musculoskeletal   Abdominal   Peds  Hematology  (+) anemia ,   Anesthesia Other Findings   Reproductive/Obstetrics                                                             Anesthesia Evaluation  Patient identified by MRN, date of birth, ID band Patient awake    Reviewed: Allergy & Precautions, H&P , NPO status , Patient's Chart, lab work & pertinent test results  History of Anesthesia Complications (+) PONV  Airway Mallampati: II   Neck ROM: full    Dental   Pulmonary asthma ,    breath sounds clear to auscultation       Cardiovascular negative cardio ROS   Rhythm:regular Rate:Normal     Neuro/Psych  Headaches,    GI/Hepatic   Endo/Other  Morbid obesity  Renal/GU      Musculoskeletal   Abdominal   Peds  Hematology   Anesthesia Other Findings   Reproductive/Obstetrics                             Anesthesia Physical Anesthesia Plan  ASA: II  Anesthesia Plan: General and Regional   Post-op Pain Management:  Regional for Post-op pain   Induction: Intravenous  Airway Management Planned: LMA  Additional Equipment:   Intra-op Plan:   Post-operative Plan:    Informed Consent: I have reviewed the patients History and Physical, chart, labs and discussed the procedure including the risks, benefits and alternatives for the proposed anesthesia with the patient or authorized representative who has indicated his/her understanding and acceptance.     Plan Discussed with: CRNA, Anesthesiologist and Surgeon  Anesthesia Plan Comments:         Anesthesia Quick Evaluation  Anesthesia Physical Anesthesia Plan  ASA: III  Anesthesia Plan: General   Post-op Pain Management:    Induction: Intravenous  Airway Management Planned: LMA and Oral ETT  Additional Equipment:   Intra-op Plan:   Post-operative Plan: Extubation in OR  Informed Consent: I have reviewed the patients History and Physical, chart, labs and discussed the procedure including the risks, benefits and alternatives for the proposed anesthesia with the patient or authorized representative who has indicated his/her understanding and acceptance.   Dental advisory given  Plan Discussed with: CRNA, Anesthesiologist and Surgeon  Anesthesia Plan Comments:         Anesthesia Quick Evaluation  Anesthesia Evaluation  Patient identified by MRN, date of birth, ID band Patient awake    Reviewed: Allergy & Precautions, H&P , NPO status , Patient's Chart, lab work & pertinent test results  History of Anesthesia Complications (+) PONV  Airway Mallampati: II   Neck ROM: full    Dental   Pulmonary asthma ,    breath sounds clear to auscultation       Cardiovascular negative cardio ROS   Rhythm:regular Rate:Normal     Neuro/Psych  Headaches,    GI/Hepatic   Endo/Other  Morbid obesity  Renal/GU      Musculoskeletal   Abdominal   Peds  Hematology   Anesthesia Other Findings   Reproductive/Obstetrics                             Anesthesia Physical Anesthesia Plan  ASA:  II  Anesthesia Plan: General and Regional   Post-op Pain Management:  Regional for Post-op pain   Induction: Intravenous  Airway Management Planned: LMA  Additional Equipment:   Intra-op Plan:   Post-operative Plan:   Informed Consent: I have reviewed the patients History and Physical, chart, labs and discussed the procedure including the risks, benefits and alternatives for the proposed anesthesia with the patient or authorized representative who has indicated his/her understanding and acceptance.     Plan Discussed with: CRNA, Anesthesiologist and Surgeon  Anesthesia Plan Comments:         Anesthesia Quick Evaluation

## 2016-08-31 NOTE — Discharge Instructions (Addendum)
°  Post Anesthesia Home Care Instructions  Activity: Get plenty of rest for the remainder of the day. A responsible adult should stay with you for 24 hours following the procedure.  For the next 24 hours, DO NOT: -Drive a car -Advertising copywriterperate machinery -Drink alcoholic beverages -Take any medication unless instructed by your physician -Make any legal decisions or sign important papers.  Meals: Start with liquid foods such as gelatin or soup. Progress to regular foods as tolerated. Avoid greasy, spicy, heavy foods. If nausea and/or vomiting occur, drink only clear liquids until the nausea and/or vomiting subsides. Call your physician if vomiting continues.  Special Instructions/Symptoms: Your throat may feel dry or sore from the anesthesia or the breathing tube placed in your throat during surgery. If this causes discomfort, gargle with warm salt water. The discomfort should disappear within 24 hours.  If you had a scopolamine patch placed behind your ear for the management of post- operative nausea and/or vomiting:  1. The medication in the patch is effective for 72 hours, after which it should be removed.  Wrap patch in a tissue and discard in the trash. Wash hands thoroughly with soap and water. 2. You may remove the patch earlier than 72 hours if you experience unpleasant side effects which may include dry mouth, dizziness or visual disturbances. 3. Avoid touching the patch. Wash your hands with soap and water after contact with the patch.      Toni ArthursJohn Hewitt, MD [facility]  Please read the following information regarding your care after surgery.  Medications  You only need a prescription for the narcotic pain medicine (ex. oxycodone, Percocet, Norco).  All of the other medicines listed below are available over the counter. X acetominophen (Tylenol) 650 mg every 4-6 hours as you need for minor pain X oxycodone as prescribed for moderate to severe pain X aleve 220 mg - 2 tablets  twice daily with food as needed for pain.   Narcotic pain medicine (ex. oxycodone, Percocet, Vicodin) will cause constipation.  To prevent this problem, take the following medicines while you are taking any pain medicine. X docusate sodium (Colace) 100 mg twice a day X senna (Senokot) 2 tablets twice a day  Weight Bearing X Bear weight when you are able on your operated leg or foot.   Cast / Splint / Dressing X Keep your dressing clean and dry.  Dont put anything (coat hanger, pencil, etc) down inside of it.  If it gets damp, use a hair dryer on the cool setting to dry it.  If it gets soaked, call the office to schedule an appointment for a cast change.    After your dressing, cast or splint is removed; you may shower, but do not soak or scrub the wound.  Allow the water to run over it, and then gently pat it dry.  Swelling It is normal for you to have swelling where you had surgery.  To reduce swelling and pain, keep your toes above your nose for at least 3 days after surgery.  It may be necessary to keep your foot or leg elevated for several weeks.  If it hurts, it should be elevated.  Follow Up Call my office at [phone] when you are discharged from the hospital or surgery center to schedule an appointment to be seen two weeks after surgery.  Call my office at [phone] if you develop a fever >101.5 F, nausea, vomiting, bleeding from the surgical site or severe pain.

## 2016-08-31 NOTE — H&P (Signed)
[name] MelnickKeira M [name] is an 30 y.o. female.   Chief Complaint:  Left ankle pain and stiffness HPI:  30 y/o female with left ankle pain and stiffness after ORIF of bimal ankle and syndesmosis.  She has failed non op treatment and presents today for removal of the syndesmosis screws.  Past Medical History:  Diagnosis Date  . Allergic asthma    prn inhaler  . Anemia    no current med.  . Obesity   . Retained orthopedic hardware 08/2016   left ankle  . 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 CERVICAL CERCLAGE  09/03/2010  . CHOLECYSTECTOMY N/A 02/11/2016   Procedure: LAPAROSCOPIC CHOLECYSTECTOMY WITH INTRAOPERATIVE CHOLANGIOGRAM;  Surgeon: Abigail Miyamotoouglas Blackman, MD;  [facility];  Service: General;  Laterality: N/A;  . ORIF ANKLE FRACTURE Left 04/06/2016   Procedure: OPEN REDUCTION INTERNAL FIXATION (ORIF) LEFT BIMALLEOLAR ANKLE FRACTURE WITH POSSIBLE DELTOID LIGAMENT REPAIR;  Surgeon: Toni ArthursJohn Celicia Minahan, MD;  Location: [facility] OR;  Service: Orthopedics;  Laterality: Left;  . ORIF ELBOW FRACTURE Right   . TUBAL LIGATION  01/18/2011    Family History  Problem Relation Age of Onset  . Diabetes Mother   . Thyroid disease Mother   . Hypertension Father   . Heart attack Father    Social History:  reports that she has never smoked. She has never used smokeless tobacco. She reports that she drinks alcohol. She reports that she does not use drugs.  Allergies:  Allergies  Allergen Reactions  . Strawberry Extract Shortness Of Breath    Medications Prior to Admission  Medication Sig Dispense Refill  . albuterol (PROVENTIL HFA;VENTOLIN HFA) 108 (90 Base) MCG/ACT inhaler Inhale 1-2 puffs into the lungs every 6 (six) hours as needed for wheezing or shortness of breath.      No results found for this or any previous visit (from the past 48 hour(s)). No results found.  ROS   No recent f/c/n/v/wt loss  Blood pressure (!) 141/86, pulse 67, temperature 98.4 F (36.9  C), temperature source Oral, resp. rate 20, height 5\' 4"  (1.626 m), weight 112.9 kg (249 lb), last menstrual period 08/25/2016, SpO2 100 %. Physical Exam  wn wd woman in nad.  A and O x 4.  Mood and affect normal.  EOMi.  resp unlabored.  L ankle with healed incisions.  No lymphadenopathy.  5/5 sterngth in PF and DF of the ankle.  Sens to LT intact.  DF to neutral.  PF to 30 deg.  Assessment/Plan L ankle pain / stiffness - to OR for removal of deep implants.  The risks and benefits of the alternative treatment options have been discussed in detail.  The patient wishes to proceed with surgery and specifically understands risks of bleeding, infection, nerve damage, blood clots, need for additional surgery, amputation and death.   Toni ArthursHEWITT, [name] Montone, MD 08/31/2016, 9:34 AM

## 2016-08-31 NOTE — Transfer of Care (Signed)
Immediate Anesthesia Transfer of Care Note  [name]  Procedure(s) Performed: Procedure(s): LEFT ANKLE REMOVAL OF DEEP IMPLANTS (Left)  Patient Location: PACU  Anesthesia Type:GA combined with regional for post-op pain  Level of Consciousness: awake, alert , oriented and patient cooperative  Airway & Oxygen Therapy: Patient Spontanous Breathing and Patient connected to face mask oxygen  Post-op Assessment: Report given to RN and Post -op Vital signs reviewed and stable  Post vital signs: Reviewed and stable  Last Vitals:  Vitals:   08/31/16 0832  BP: (!) 141/86  Pulse: 67  Resp: 20  Temp: 36.9 C    Last Pain:  Vitals:   08/31/16 0854  TempSrc:   PainSc: 7       Patients Stated Pain Goal: 3 (08/31/16 0854)  Complications: No apparent anesthesia complications

## 2016-08-31 NOTE — Anesthesia Procedure Notes (Signed)
Procedure Name: LMA Insertion Date/Time: 08/31/2016 9:58 AM Performed by: Jerrit Horen D Pre-anesthesia Checklist: Patient identified, Emergency Drugs available, Suction available and Patient being monitored Patient Re-evaluated:Patient Re-evaluated prior to inductionOxygen Delivery Method: Circle system utilized Preoxygenation: Pre-oxygenation with 100% oxygen Intubation Type: IV induction Ventilation: Mask ventilation without difficulty LMA: LMA inserted LMA Size: 4.0 Number of attempts: 1 Airway Equipment and Method: Bite block Placement Confirmation: positive ETCO2 Tube secured with: Tape Dental Injury: Teeth and Oropharynx as per pre-operative assessment

## 2016-09-01 ENCOUNTER — Encounter (HOSPITAL_BASED_OUTPATIENT_CLINIC_OR_DEPARTMENT_OTHER): Payer: Self-pay | Admitting: Orthopedic Surgery

## 2016-09-01 NOTE — Op Note (Signed)
[name] [name]:  [name] [name]                ACCOUNT NO.:  [id number]  MEDICAL RECORD NO.:  [id number]  LOCATION:                                 FACILITY:  PHYSICIAN:  Toni ArthursJohn Maat Kafer, MD        DATE OF BIRTH:  03-Jan-1986  DATE OF PROCEDURE:  08/31/2016 DATE OF DISCHARGE:                              OPERATIVE REPORT   PREOPERATIVE DIAGNOSIS:  Left ankle retained hardware (tibia and fibula).  POSTOPERATIVE DIAGNOSIS:  Left ankle retained hardware (tibia and fibula).  PROCEDURE: 1. Left fibula removal of deep implants. 2. Left tibia removal of deep implants through a separate incision. 3. Left ankle AP and lateral radiographs.  SURGEON:  Toni ArthursJohn Bertis Hustead, MD  ASSISTANT:  Alfredo MartinezJustin Ollis, PA-C.  ANESTHESIA:  General, regional.  ESTIMATED BLOOD LOSS:  Minimal.  TOURNIQUET TIME:  Less than 30 minutes with an ankle Esmarch.  COMPLICATIONS:  None apparent.  DISPOSITION:  Extubated, awake, and stable to recovery.  INDICATION FOR PROCEDURE:  The patient is a 30 year old woman, who has a history of left ankle fracture.  She underwent open reduction and internal fixation of her posterior and lateral malleolus fractures as well as syndesmosis several months ago.  She has healed from that injury.  She complains of continued stiffness at the ankle.  Syndesmosis screws are still intact.  She presents today for removal of the 2 syndesmosis screws.  She understands the risks and benefits of the alternative treatment options and elects surgical treatment.  She specifically understands risks of bleeding, infection, nerve damage, blood clots, need for additional surgery, continued pain, nonunion, amputation, and death.  PROCEDURE IN DETAIL:  After preoperative consent was obtained and the correct operative site was identified, the patient was brought to the operating room and placed supine on the operating table.  General anesthesia was induced.  Preoperative antibiotics were administered. Surgical  time-out was taken.  Left lower extremity was prepped and draped in standard sterile fashion.  The lateral incision was identified.  The 2 syndesmosis screws were localized with an AP radiograph.  The incision at this location was opened again sharply between the 2 screw heads.  Dissection was then carried down to the more distal screw.  The head was cleared of all scar tissue.  The screw was removed in its entirety.  The more proximal screw head was identified. The screw head was cleared of all soft tissue.  The screw head was removed.  At this point, the syndesmosis screw was noted to be broken with the broken end of the screw extending into the fibula across the syndesmosis.  As a result of this positions, decision was made to proceed with hardware removal from the medial side of the tibia through a separate incision since this screw was protruding into the fibula and likely contributing to the patient's pain.  AP and lateral radiographs were used to localize the tip of the screw at the medial cortex of the tibia.  The incision was made and dissection was carried down through the subcutaneous tissues to the periosteum. The periosteum was incised.  A curette was used to open the cortex over the tip of the screw.  A trephine was then utilized to remove the screw in its entirety.  AP and lateral radiographs confirmed complete removal of both syndesmosis screws.  Wound was irrigated copiously.  The incisions were closed with Monocryl and nylon.  Both incisions were infiltrated with 0.25% Marcaine with epinephrine.  Sterile dressings were applied followed by compression wrap.  Tourniquet was released after application of dressings at less than 30 minutes.  The patient was awakened from anesthesia and transported to the recovery room in stable condition.  FOLLOWUP PLAN:  The patient will be weightbearing as tolerated on the left lower extremity.  She will avoid jumping or running for a  month. She will follow up with me in 2 weeks for suture removal.  Alfredo MartinezJustin Ollis, PA-C, was present and scrubbed for the duration of the case.  His assistance was essential in positioning the patient, prepping and draping, gaining and maintaining exposure, performing the operation, and closing and dressing the wounds.  RADIOGRAPHS:  AP and lateral radiographs of the left ankle were obtained intraoperatively.  These show interval removal of both syndesmosis screws.  The fractures are appropriately healed.  Other hardware is appropriately positioned.  No evidence of acute injury.     Toni ArthursJohn Riley Hallum, MD     JH/MEDQ  D:  08/31/2016  T:  09/01/2016  Job:  [id number]

## 2016-10-12 IMAGING — CR DG ANKLE COMPLETE 3+V*L*
3 series · 3 of 3 positions shown · non-contrast
Comparison: None.

CLINICAL DATA: Fall, left lower leg pain, kickball injury

EXAM:
LEFT ANKLE COMPLETE - 3+ VIEW

[ankle ap]
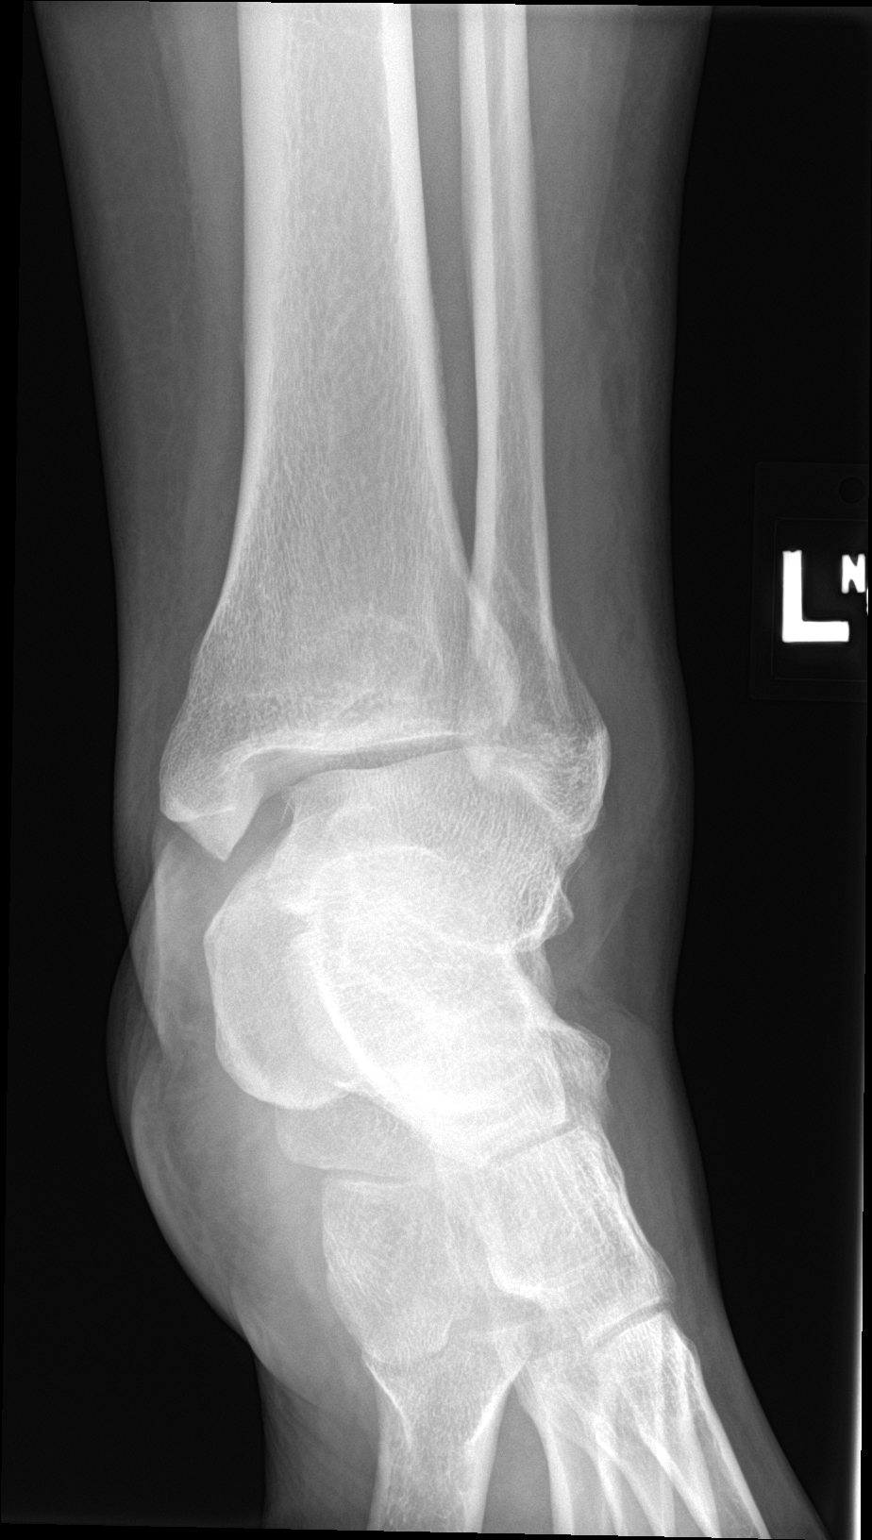

[ankle obl]
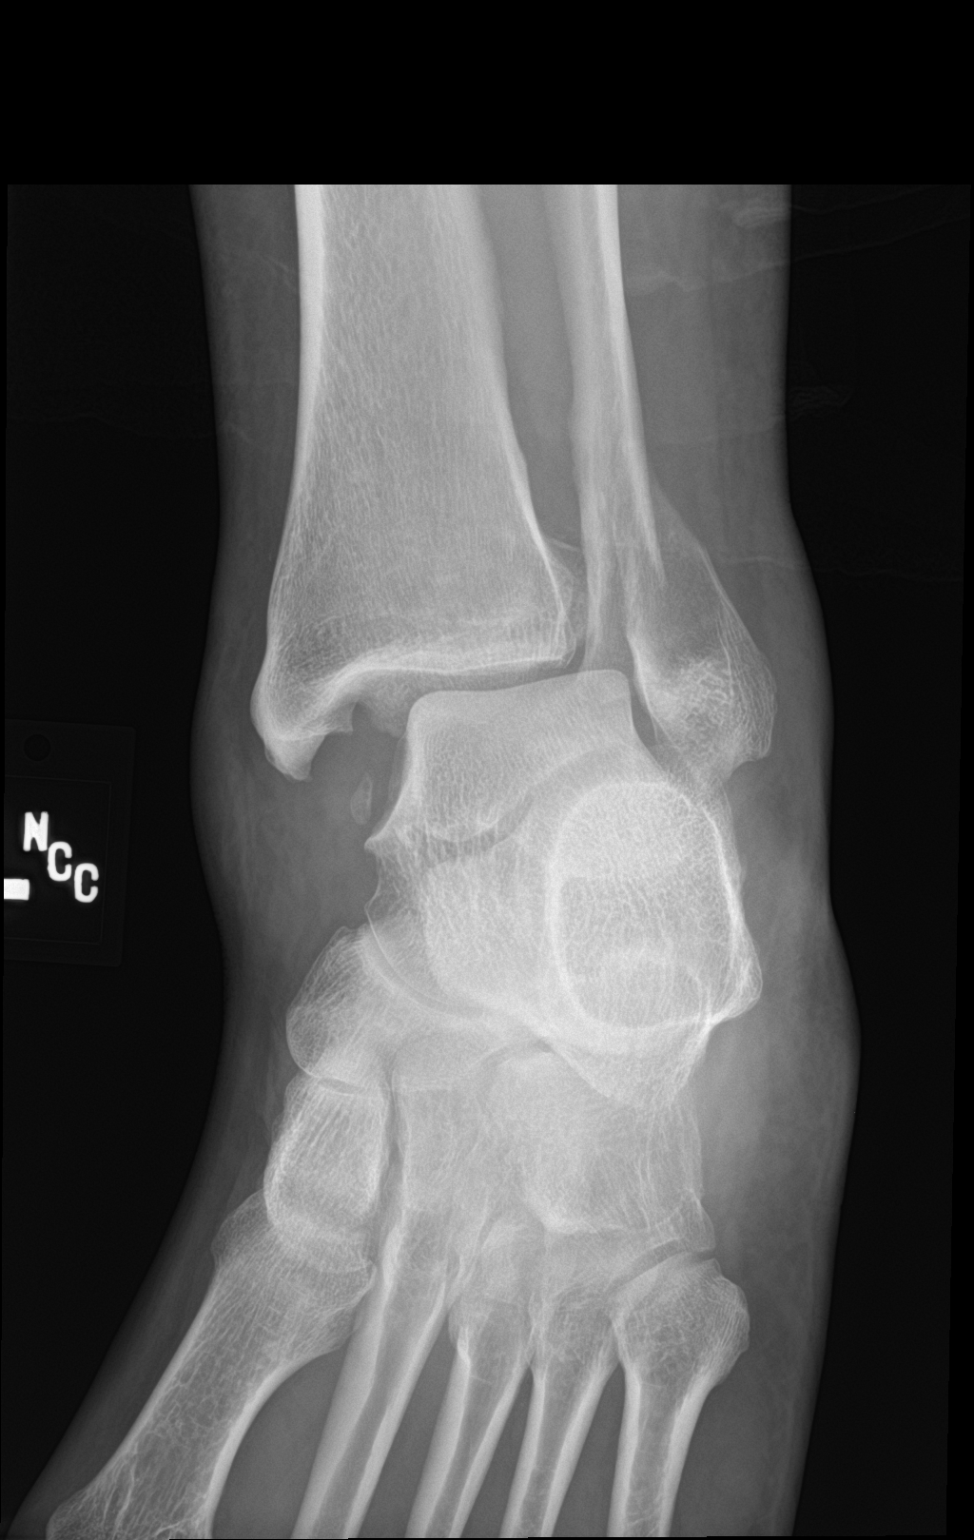

[ankle lat]
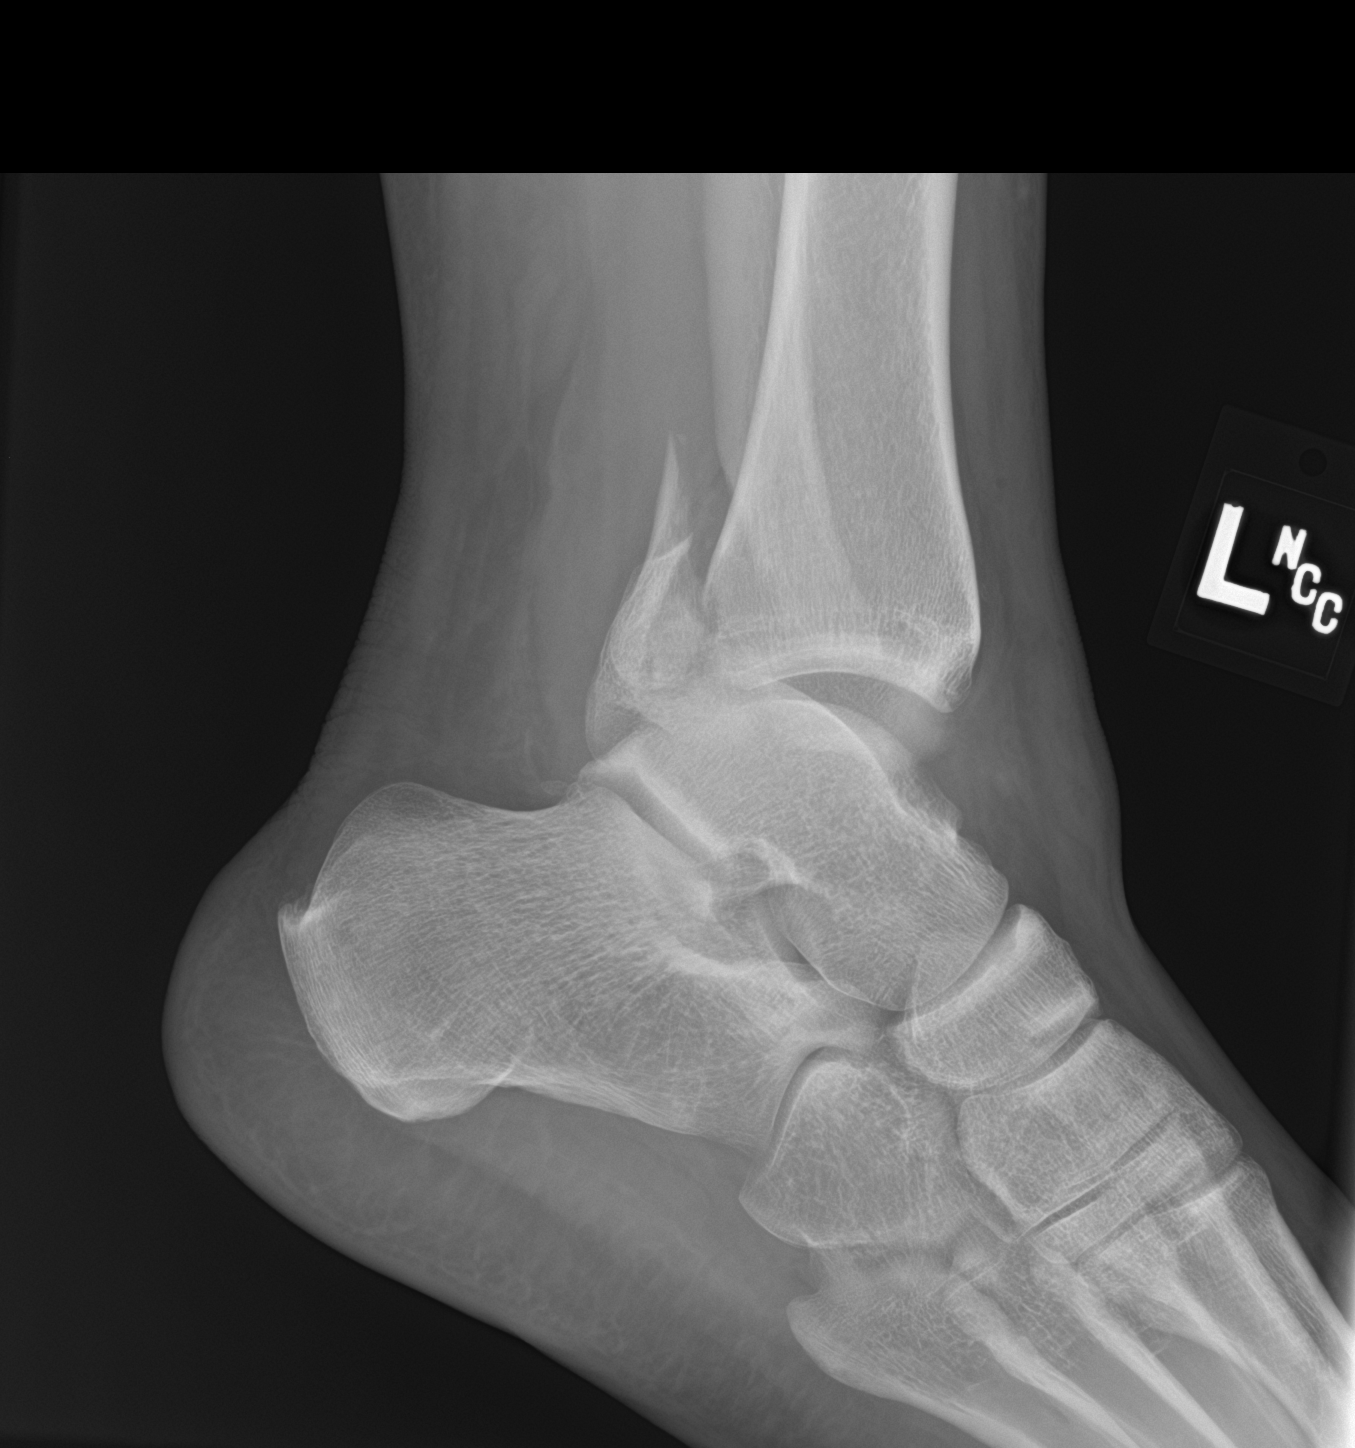

[3 of 3 positions shown; findings below may reference images not displayed]

FINDINGS: Distal fibular fracture with approximately [DATE] shaft width lateral
displacement.

Suspected mildly displaced posterior malleolar fracture on the
lateral view.

Mild widening of the anterior tibiotalar joint space on the lateral
view, raising the possibility of ligamentous disruption. Additional
widening of the medial ankle mortise with lateral talar shift.

Associated mild to moderate soft tissue swelling.
IMPRESSION: Mildly displaced distal fibular fracture, as above.

Suspected mildly displaced posterior malleolar fracture.

Widening of the medial ankle mortise and anterior tibiotalar joint
space, raising the possibility of ligamentous disruption.

## 2018-04-11 ENCOUNTER — Emergency Department (HOSPITAL_COMMUNITY)
Admission: EM | Admit: 2018-04-11 | Discharge: 2018-04-11 | Disposition: A | Discharge status: Home / Self Care | Payer: Medicaid Other | Attending: Emergency Medicine | Admitting: Emergency Medicine

## 2018-04-11 ENCOUNTER — Encounter (HOSPITAL_COMMUNITY): Payer: Self-pay | Admitting: *Deleted

## 2018-04-11 DIAGNOSIS — Z79899 Other long term (current) drug therapy: Secondary | ICD-10-CM | POA: Diagnosis not present

## 2018-04-11 DIAGNOSIS — J45909 Unspecified asthma, uncomplicated: Secondary | ICD-10-CM | POA: Insufficient documentation

## 2018-04-11 DIAGNOSIS — R040 Epistaxis: Secondary | ICD-10-CM | POA: Diagnosis not present

## 2018-04-11 DIAGNOSIS — Z862 Personal history of diseases of the blood and blood-forming organs and certain disorders involving the immune mechanism: Secondary | ICD-10-CM | POA: Insufficient documentation

## 2018-04-11 LAB — I-STAT CHEM 8, ED
BUN: 9 mg/dL (ref 6–20)
CHLORIDE: 104 mmol/L (ref 98–111)
Calcium, Ion: 1.15 mmol/L (ref 1.15–1.40)
Creatinine, Ser: 0.6 mg/dL (ref 0.44–1.00)
Glucose, Bld: 113 mg/dL — ABNORMAL HIGH (ref 70–99)
HEMATOCRIT: 35 % — AB (ref 36.0–46.0)
HEMOGLOBIN: 11.9 g/dL — AB (ref 12.0–15.0)
POTASSIUM: 3.8 mmol/L (ref 3.5–5.1)
SODIUM: 138 mmol/L (ref 135–145)
TCO2: 23 mmol/L (ref 22–32)

## 2018-04-11 LAB — CBC WITH DIFFERENTIAL/PLATELET
ABS IMMATURE GRANULOCYTES: 0 10*3/uL (ref 0.0–0.1)
BASOS ABS: 0 10*3/uL (ref 0.0–0.1)
Basophils Relative: 1 %
Eosinophils Absolute: 0.2 10*3/uL (ref 0.0–0.7)
Eosinophils Relative: 3 %
HCT: 35.9 % — ABNORMAL LOW (ref 36.0–46.0)
HEMOGLOBIN: 11.8 g/dL — AB (ref 12.0–15.0)
Immature Granulocytes: 0 %
Lymphocytes Relative: 34 %
Lymphs Abs: 2.3 10*3/uL (ref 0.7–4.0)
MCH: 27.9 pg (ref 26.0–34.0)
MCHC: 32.9 g/dL (ref 30.0–36.0)
MCV: 84.9 fL (ref 78.0–100.0)
MONO ABS: 0.3 10*3/uL (ref 0.1–1.0)
MONOS PCT: 4 %
NEUTROS ABS: 3.9 10*3/uL (ref 1.7–7.7)
Neutrophils Relative %: 58 %
PLATELETS: 96 10*3/uL — AB (ref 150–400)
RBC: 4.23 MIL/uL (ref 3.87–5.11)
RDW: 16 % — ABNORMAL HIGH (ref 11.5–15.5)
WBC: 6.7 10*3/uL (ref 4.0–10.5)

## 2018-04-11 MED ORDER — OXYMETAZOLINE HCL 0.05 % NA SOLN
1.0000 | Freq: Once | NASAL | Status: AC
  Administered 2018-04-11: 1 via NASAL
  Filled 2018-04-11: qty 15

## 2018-04-11 NOTE — ED Provider Notes (Signed)
[facility] Provider Note   CSN: [id number] Arrival date & time: 04/11/18  [id number]     History   Chief Complaint Chief Complaint  Patient presents with  . Epistaxis    HPI [name] [name] is a 32 y.o. female.  The history is provided by the patient. No language interpreter was used.  Epistaxis      32 year old female with history of thrombocytopenia presented for evaluation of nosebleed.  Patient report for the past 2 hours she has had persistent nosebleed coming from both side of the nose.  She also noticed clots, and blood drained to the back of her throat.  It has sensate but she was concerned.  The last time that she has nosebleed was 2 days prior lasting for less than 1 hour.  She does endorse some lightheadedness and dizziness with this nosebleed.  She denies any significant pain, no headache, no neck pain chest pain.  She denies any injury.  She admits to having history of lower platelets.  She denies any other abnormal bleeding.  She has had prior urine ablation and not having her menstruation.  She does have a hematologist appointment today but decided to come here due to the persistent nosebleed.  Past Medical History:  Diagnosis Date  . Allergic asthma    prn inhaler  . Anemia    no current med.  . Obesity   . Retained orthopedic hardware 08/2016   left ankle  . 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 Symptomatic cholelithiasis 02/11/2016  . Thrombocytopenia (HCC) 09/14/2014  . 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 CERVICAL CERCLAGE  09/03/2010  . CHOLECYSTECTOMY N/A 02/11/2016   Procedure: LAPAROSCOPIC CHOLECYSTECTOMY WITH INTRAOPERATIVE CHOLANGIOGRAM;  Surgeon: Abigail Miyamotoouglas Blackman, MD;  [facility];  Service: General;  Laterality: N/A;  . HARDWARE REMOVAL Left 08/31/2016   Procedure: LEFT ANKLE REMOVAL OF  DEEP IMPLANTS;  Surgeon: Toni ArthursJohn Hewitt, MD;  Location: [facility];  Service: Orthopedics;  Laterality: Left;  . ORIF ANKLE FRACTURE Left 04/06/2016   Procedure: OPEN REDUCTION INTERNAL FIXATION (ORIF) LEFT BIMALLEOLAR ANKLE FRACTURE WITH POSSIBLE DELTOID LIGAMENT REPAIR;  Surgeon: Toni ArthursJohn Hewitt, MD;  Location: [facility] OR;  Service: Orthopedics;  Laterality: Left;  . ORIF ELBOW FRACTURE Right   . TUBAL LIGATION  01/18/2011     OB History    Gravida  3   Para  1   Term      Preterm      AB      Living  1     SAB      TAB      Ectopic      Multiple      Live Births               Home Medications    Prior to Admission medications   Medication Sig Start Date End Date Taking? Authorizing Provider  albuterol (PROVENTIL HFA;VENTOLIN HFA) 108 (90 Base) MCG/ACT inhaler Inhale 1-2 puffs into the lungs every 6 (six) hours as needed for wheezing or shortness of breath.    [provider]  docusate sodium (COLACE) 100 MG capsule Take 1 capsule (100 mg total) by mouth 2 (two) times daily. While taking narcotic pain medicine. 08/31/16   Jacinta Shoellis, Justin Pike, PA-C  oxyCODONE (ROXICODONE) 5 MG immediate release tablet Take 1-2 tablets (5-10 mg total)  by mouth every 4 (four) hours as needed for moderate pain or severe pain. 08/31/16   Jacinta Shoe, PA-C  senna (SENOKOT) 8.6 MG TABS tablet Take 2 tablets (17.2 mg total) by mouth 2 (two) times daily. 08/31/16   Jacinta Shoe, PA-C    Family History Family History  Problem Relation Age of Onset  . Diabetes Mother   . Thyroid disease Mother   . Hypertension Father   . Heart attack Father     Social History Social History   Tobacco Use  . Smoking status: Never Smoker  . Smokeless tobacco: Never Used  Substance Use Topics  . Alcohol use: Yes    Alcohol/week: 0.0 oz    Comment: socially  . Drug use: No     Allergies   Strawberry extract   Review of Systems Review of Systems  HENT: Positive for  nosebleeds.   Neurological: Negative for headaches.  Hematological: Bruises/bleeds easily.     Physical Exam Updated Vital Signs BP (!) 160/88 (BP Location: Right Arm)   Pulse 82   Temp 98 F (36.7 C) (Oral)   Resp 20   SpO2 100%   Physical Exam  Constitutional: She appears well-developed and well-nourished. No distress.  HENT:  Head: Atraumatic.  Nose: Small amount of blood noted to right nares at the nasal conchae.  No nose tenderness no active bleeding.    Throat exam unremarkable.  Eyes: Conjunctivae are normal.  Neck: Neck supple.  Cardiovascular: Normal rate and regular rhythm.  Pulmonary/Chest: Effort normal and breath sounds normal.  Abdominal: Soft. She exhibits no distension. There is no tenderness.  Neurological: She is alert.  Skin: No rash noted.  Psychiatric: She has a normal mood and affect.  Nursing note and vitals reviewed.    ED Treatments / Results  Labs (all labs ordered are listed, but only abnormal results are displayed) Labs Reviewed  CBC WITH DIFFERENTIAL/PLATELET - Abnormal; Notable for the following components:      Result Value   Hemoglobin 11.8 (*)    HCT 35.9 (*)    RDW 16.0 (*)    Platelets 96 (*)    All other components within normal limits  I-STAT CHEM 8, ED - Abnormal; Notable for the following components:   Glucose, Bld 113 (*)    Hemoglobin 11.9 (*)    HCT 35.0 (*)    All other components within normal limits    EKG None  Radiology No results found.  Procedures Procedures (including critical care time)  Medications Ordered in ED Medications  oxymetazoline (AFRIN) 0.05 % nasal spray 1 spray (1 spray Each Nare Given 04/11/18 1137)     Initial Impression / Assessment and Plan / ED Course  I have reviewed the triage vital signs and the nursing notes.  Pertinent labs & imaging results that were available during my care of the patient were reviewed by me and considered in my medical decision making (see chart for  details).     BP (!) 160/88 (BP Location: Right Arm)   Pulse 82   Temp 98 F (36.7 C) (Oral)   Resp 20   SpO2 100%    Final Clinical Impressions(s) / ED Diagnoses   Final diagnoses:  Epistaxis, recurrent    ED Discharge Orders    None     9:42 AM Patient here for recurrent nosebleed.  Not actively bleeding currently.  History of thrombocytopenia, last platelets was in the 80s.  We will recheck platelets count and  CBC.  I offer Afrin spray but patient declined. Care discussed with Dr. Madilyn Hook.  11:56 AM Labs notable for mild thrombocytopenia with platelets of 96 thousand, and improvement from prior value.  No significant anemia, electro lites panels are reassuring, normal kidney function.  We had patient blow out from her nose, and Afrin spray was applied.  Patient had been monitoring for the past 30 minutes without any active bleeding.   Fayrene Helper, PA-C 04/11/18 1203    Tilden Fossa, MD 04/17/18 [phone]

## 2018-04-11 NOTE — Discharge Instructions (Signed)
Please use afrin spray three times daily for the next 3 days.  You may call and follow up with ENT specialist for further evaluation of your recurrent nosebleed.  Please return if you have any concerns.

## 2018-04-11 NOTE — ED Triage Notes (Signed)
Pt in c/o nose bleed for the last few days, history of same but states it doesn't normally last this long

## 2018-04-11 NOTE — ED Notes (Signed)
Gave pt ginger ale , pt able to keep fluids down

## 2019-02-07 ENCOUNTER — Other Ambulatory Visit: Payer: Self-pay

## 2019-02-07 ENCOUNTER — Encounter (HOSPITAL_COMMUNITY): Payer: Self-pay | Admitting: Emergency Medicine

## 2019-02-07 ENCOUNTER — Emergency Department (HOSPITAL_COMMUNITY)
Admission: EM | Admit: 2019-02-07 | Discharge: 2019-02-08 | Disposition: A | Discharge status: Home / Self Care | Payer: Self-pay | Attending: Emergency Medicine | Admitting: Emergency Medicine

## 2019-02-07 DIAGNOSIS — R197 Diarrhea, unspecified: Secondary | ICD-10-CM | POA: Insufficient documentation

## 2019-02-07 DIAGNOSIS — R110 Nausea: Secondary | ICD-10-CM | POA: Insufficient documentation

## 2019-02-07 DIAGNOSIS — N760 Acute vaginitis: Secondary | ICD-10-CM | POA: Insufficient documentation

## 2019-02-07 DIAGNOSIS — B9689 Other specified bacterial agents as the cause of diseases classified elsewhere: Secondary | ICD-10-CM

## 2019-02-07 LAB — CBC
HCT: 39.1 % (ref 36.0–46.0)
Hemoglobin: 13.2 g/dL (ref 12.0–15.0)
MCH: 28.3 pg (ref 26.0–34.0)
MCHC: 33.8 g/dL (ref 30.0–36.0)
MCV: 83.9 fL (ref 80.0–100.0)
Platelets: 96 10*3/uL — ABNORMAL LOW (ref 150–400)
RBC: 4.66 MIL/uL (ref 3.87–5.11)
RDW: 13.6 % (ref 11.5–15.5)
WBC: 8.5 10*3/uL (ref 4.0–10.5)
nRBC: 0 % (ref 0.0–0.2)

## 2019-02-07 LAB — URINALYSIS, ROUTINE W REFLEX MICROSCOPIC
Bilirubin Urine: NEGATIVE
Glucose, UA: NEGATIVE mg/dL
Hgb urine dipstick: NEGATIVE
Ketones, ur: NEGATIVE mg/dL
Leukocytes,Ua: NEGATIVE
Nitrite: NEGATIVE
Protein, ur: NEGATIVE mg/dL
Specific Gravity, Urine: 1.018 (ref 1.005–1.030)
pH: 6 (ref 5.0–8.0)

## 2019-02-07 LAB — COMPREHENSIVE METABOLIC PANEL
ALT: 22 U/L (ref 0–44)
AST: 28 U/L (ref 15–41)
Albumin: 4.2 g/dL (ref 3.5–5.0)
Alkaline Phosphatase: 58 U/L (ref 38–126)
Anion gap: 14 (ref 5–15)
BUN: 8 mg/dL (ref 6–20)
CO2: 19 mmol/L — ABNORMAL LOW (ref 22–32)
Calcium: 8.8 mg/dL — ABNORMAL LOW (ref 8.9–10.3)
Chloride: 99 mmol/L (ref 98–111)
Creatinine, Ser: 0.8 mg/dL (ref 0.44–1.00)
GFR calc Af Amer: >60 mL/min (ref >60)
GFR calc non Af Amer: >60 mL/min (ref >60)
Glucose, Bld: 118 mg/dL — ABNORMAL HIGH (ref 70–99)
Potassium: 4.7 mmol/L (ref 3.5–5.1)
Sodium: 132 mmol/L — ABNORMAL LOW (ref 135–145)
Total Bilirubin: 0.5 mg/dL (ref 0.3–1.2)
Total Protein: 7.5 g/dL (ref 6.5–8.1)

## 2019-02-07 LAB — LIPASE, BLOOD: Lipase: 26 U/L (ref 11–51)

## 2019-02-07 LAB — I-STAT BETA HCG BLOOD, ED (MC, WL, AP ONLY): I-stat hCG, quantitative: <5 m[IU]/mL (ref <5)

## 2019-02-07 MED ORDER — SODIUM CHLORIDE 0.9% FLUSH: 3.0000 mL | Freq: Once | INTRAVENOUS | Status: DC

## 2019-02-07 MED ORDER — OXYCODONE-ACETAMINOPHEN 5-325 MG PO TABS
1.0000 | ORAL_TABLET | ORAL | Status: DC | PRN
  Administered 2019-02-07: 20:00:00 1 via ORAL
  Filled 2019-02-07: qty 1

## 2019-02-07 NOTE — ED Notes (Signed)
Pt called in lobby for vitals recheck but no answer 

## 2019-02-07 NOTE — ED Triage Notes (Signed)
Pt reports RLQ abd pain, N/V/D X2 days. Pt tearful in triage and appears to be in discomfort.

## 2019-02-07 NOTE — ED Notes (Signed)
Call from lab stating CBC was clotted, phlebotomy made aware

## 2019-02-07 NOTE — ED Notes (Addendum)
Patient did not respond to name being called in the waiting room to update vitals.

## 2019-02-08 ENCOUNTER — Emergency Department (HOSPITAL_COMMUNITY): Payer: Self-pay

## 2019-02-08 LAB — WET PREP, GENITAL
Sperm: NONE SEEN
Trich, Wet Prep: NONE SEEN
Yeast Wet Prep HPF POC: NONE SEEN

## 2019-02-08 MED ORDER — IOHEXOL 300 MG/ML  SOLN
100.0000 mL | Freq: Once | INTRAMUSCULAR | Status: AC | PRN
  Administered 2019-02-08: 100 mL via INTRAVENOUS

## 2019-02-08 MED ORDER — METRONIDAZOLE 500 MG PO TABS: 500.0000 mg | ORAL_TABLET | Freq: Two times a day (BID) | ORAL | 0 refills | Status: DC

## 2019-02-08 MED ORDER — DICYCLOMINE HCL 20 MG PO TABS: 20.0000 mg | ORAL_TABLET | Freq: Two times a day (BID) | ORAL | 0 refills | Status: DC

## 2019-02-08 MED ORDER — ONDANSETRON HCL 4 MG/2ML IJ SOLN
4.0000 mg | Freq: Once | INTRAMUSCULAR | Status: AC
  Administered 2019-02-08: 03:00:00 4 mg via INTRAVENOUS
  Filled 2019-02-08: qty 2

## 2019-02-08 MED ORDER — ONDANSETRON 4 MG PO TBDP
4.0000 mg | ORAL_TABLET | Freq: Once | ORAL | Status: AC
  Administered 2019-02-08: 07:00:00 4 mg via ORAL
  Filled 2019-02-08: qty 1

## 2019-02-08 MED ORDER — SODIUM CHLORIDE 0.9 % IV BOLUS
1000.0000 mL | Freq: Once | INTRAVENOUS | Status: AC
  Administered 2019-02-08: 1000 mL via INTRAVENOUS

## 2019-02-08 MED ORDER — DICYCLOMINE HCL 10 MG/ML IM SOLN
20.0000 mg | Freq: Once | INTRAMUSCULAR | Status: AC
  Administered 2019-02-08: 20 mg via INTRAMUSCULAR
  Filled 2019-02-08: qty 2

## 2019-02-08 MED ORDER — ONDANSETRON 4 MG PO TBDP: 4.0000 mg | ORAL_TABLET | Freq: Three times a day (TID) | ORAL | 0 refills | Status: DC | PRN

## 2019-02-08 MED ORDER — MORPHINE SULFATE (PF) 4 MG/ML IV SOLN
4.0000 mg | Freq: Once | INTRAVENOUS | Status: AC
  Administered 2019-02-08: 4 mg via INTRAVENOUS
  Filled 2019-02-08: qty 1

## 2019-02-08 NOTE — ED Notes (Signed)
Pt given Sprite for PO challenge. Will continue to monitor.

## 2019-02-08 NOTE — ED Notes (Addendum)
Called patients name for a second time to get vitals while in the waiting room, no response.

## 2019-02-08 NOTE — ED Notes (Signed)
E-signature not available, verbalized understanding of DC instructions and Rx 

## 2019-02-08 NOTE — ED Notes (Signed)
Pt is in triage waiting area

## 2019-02-08 NOTE — Discharge Instructions (Signed)
Thank you for allowing me to care for you today in the Emergency Department.   Take 1 tablet of Flagyl by mouth 2 times daily for the next week for bacterial vaginosis.  Do not drink any alcohol while you are taking this medication because it causes severe vomiting.  For nausea and vomiting, let 1 tablet of Zofran dissolve in your tongue every 8 hours as needed.  You can take 1 tablet of Bentyl by mouth 2 times daily for abdominal pain.  This is okay to use with Tylenol and ibuprofen as well.  Use as directed on the labels.  If you continue to have loose stools or diarrhea, Imodium is available over-the-counter. Only use as directed.

## 2019-02-08 NOTE — ED Provider Notes (Signed)
[facility] EMERGENCY DEPARTMENT Provider Note   CSN: [id number] Arrival date & time: 02/07/19  1839    History   Chief Complaint Chief Complaint  Patient presents with  . Abdominal Pain    HPI [name] [name] is a 33 y.o. female with a history of congenital thrombocytopenia who presents to the emergency department with a chief complaint of right lower quadrant pain.  The patient endorses constant right lower quadrant pain, characterized as cramping, for the last 2 days.  She reports the pain significantly worsened over the last 24 hours.  She reports 5 episodes of nonbloody diarrhea over the last 24 hours and nausea.  No vomiting.  She denies fever, chills, back pain, dysuria, hematuria, vaginal bleeding, vaginal discharge, shortness of breath or cough.  She reports she attempted to take over-the-counter medication at home without improvement in her symptoms.  Surgical history includes cholecystectomy.  She reports that she is sexually active with one female partner.  She has had a tubal ligation and does not suspect that she would be pregnant.  They do not use condoms.  She is not on birth control.  Reports a history of previous ovarian cysts.     The history is provided by the patient. No language interpreter was used.    Past Medical History:  Diagnosis Date  . Allergic asthma    prn inhaler  . Anemia    no current med.  . Obesity   . Retained orthopedic hardware 08/2016   left ankle  . Thrombocytopenic [facility])     Patient Active Problem List   Diagnosis Date Noted  . Symptomatic cholelithiasis 02/11/2016  . Thrombocytopenia (HCC) 09/14/2014  . 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 CERVICAL CERCLAGE  09/03/2010  . CHOLECYSTECTOMY N/A 02/11/2016   Procedure: LAPAROSCOPIC CHOLECYSTECTOMY WITH INTRAOPERATIVE CHOLANGIOGRAM;  Surgeon: Abigail Miyamoto, MD;  Location: [facility] OR;  Service:  General;  Laterality: N/A;  . HARDWARE REMOVAL Left 08/31/2016   Procedure: LEFT ANKLE REMOVAL OF DEEP IMPLANTS;  Surgeon: Toni Arthurs, MD;  Location: [facility];  Service: Orthopedics;  Laterality: Left;  . ORIF ANKLE FRACTURE Left 04/06/2016   Procedure: OPEN REDUCTION INTERNAL FIXATION (ORIF) LEFT BIMALLEOLAR ANKLE FRACTURE WITH POSSIBLE DELTOID LIGAMENT REPAIR;  Surgeon: Toni Arthurs, MD;  Location: [facility] OR;  Service: Orthopedics;  Laterality: Left;  . ORIF ELBOW FRACTURE Right   . TUBAL LIGATION  01/18/2011     OB History    Gravida  3   Para  1   Term      Preterm      AB      Living  1     SAB      TAB      Ectopic      Multiple      Live Births               Home Medications    Prior to Admission medications   Medication Sig Start Date End Date Taking? Authorizing Provider  albuterol (PROVENTIL HFA;VENTOLIN HFA) 108 (90 Base) MCG/ACT inhaler Inhale 1-2 puffs into the lungs every 6 (six) hours as needed for wheezing or shortness of breath.   Yes [provider]  loratadine (CLARITIN) 10 MG tablet Take 10 mg by mouth daily as needed for allergies.   Yes [provider]  dicyclomine (BENTYL) 20 MG tablet Take 1 tablet (20 mg total) by mouth 2 (  two) times daily. 02/08/19   Ladrea Holladay A, PA-C  metroNIDAZOLE (FLAGYL) 500 MG tablet Take 1 tablet (500 mg total) by mouth 2 (two) times daily. 02/08/19   Torence Palmeri A, PA-C  ondansetron (ZOFRAN ODT) 4 MG disintegrating tablet Take 1 tablet (4 mg total) by mouth every 8 (eight) hours as needed. 02/08/19   Phila Shoaf A, PA-C    Family History Family History  Problem Relation Age of Onset  . Diabetes Mother   . Thyroid disease Mother   . Hypertension Father   . Heart attack Father     Social History Social History   Tobacco Use  . Smoking status: Never Smoker  . Smokeless tobacco: Never Used  Substance Use Topics  . Alcohol use: Yes    Alcohol/week: 0.0 standard drinks     Comment: socially  . Drug use: No     Allergies   Strawberry extract   Review of Systems Review of Systems  Constitutional: Negative for activity change, chills and fever.  HENT: Negative for congestion and sore throat.   Respiratory: Negative for shortness of breath and wheezing.   Cardiovascular: Negative for chest pain and palpitations.  Gastrointestinal: Positive for abdominal pain, diarrhea and nausea. Negative for blood in stool, constipation, rectal pain and vomiting.  Genitourinary: Negative for dysuria, frequency, hematuria, urgency, vaginal discharge and vaginal pain.  Musculoskeletal: Negative for back pain, neck pain and neck stiffness.  Skin: Negative for rash.  Allergic/Immunologic: Negative for immunocompromised state.  Neurological: Negative for syncope, weakness, numbness and headaches.  Psychiatric/Behavioral: Negative for confusion.     Physical Exam Updated Vital Signs BP 140/86   Pulse 63   Temp 98.3 F (36.8 C) (Oral)   Resp 14   Ht  (1.6 m)   Wt 117.9 kg   SpO2 100%   BMI 46.06 kg/m   Physical Exam Vitals signs and nursing note reviewed. Exam conducted with a chaperone present.  Constitutional:      General: She is not in acute distress.    Appearance: She is obese. She is not ill-appearing, toxic-appearing or diaphoretic.     Comments: Tearful and appears uncomfortable.  HENT:     Head: Normocephalic.  Eyes:     Conjunctiva/sclera: Conjunctivae normal.  Neck:     Musculoskeletal: Neck supple.  Cardiovascular:     Rate and Rhythm: Normal rate and regular rhythm.     Heart sounds: No murmur. No friction rub. No gallop.   Pulmonary:     Effort: Pulmonary effort is normal. No respiratory distress.     Breath sounds: No stridor. No wheezing, rhonchi or rales.  Chest:     Chest wall: No tenderness.  Abdominal:     General: There is no distension.     Palpations: Abdomen is soft. There is no mass.     Tenderness: There is abdominal  tenderness. There is guarding. There is no right CVA tenderness, left CVA tenderness or rebound.     Hernia: No hernia is present.     Comments: To palpation in the right pelvic region and right lower quadrant.  She is tender over McBurney's point.  There is mild guarding but no rebound.  No CVA tenderness bilaterally.  Suprapubic and left lower quadrant as well as the upper abdomen are unremarkable.  Genitourinary:    Comments: Chaperoned exam.  Appears markedly uncomfortable with speculum insertion. No cervical motion tenderness, but she endorses pain with palpation of the vaginal wall and bilateral adnexa.  No adnexal fullness.  Vaginal mucosa is erythematous.  Cervix does not appear friable.  There is a moderate amount of malodorous yellow/brownish discharge in the vaginal vault. Skin:    General: Skin is warm.     Findings: No rash.  Neurological:     Mental Status: She is alert.  Psychiatric:        Behavior: Behavior normal.      ED Treatments / Results  Labs (all labs ordered are listed, but only abnormal results are displayed) Labs Reviewed  WET PREP, GENITAL - Abnormal; Notable for the following components:      Result Value   Clue Cells Wet Prep HPF POC PRESENT (*)    WBC, Wet Prep HPF POC FEW (*)    All other components within normal limits  COMPREHENSIVE METABOLIC PANEL - Abnormal; Notable for the following components:   Sodium 132 (*)    CO2 19 (*)    Glucose, Bld 118 (*)    Calcium 8.8 (*)    All other components within normal limits  URINALYSIS, ROUTINE W REFLEX MICROSCOPIC - Abnormal; Notable for the following components:   APPearance HAZY (*)    All other components within normal limits  CBC - Abnormal; Notable for the following components:   Platelets 96 (*)    All other components within normal limits  LIPASE, BLOOD  I-STAT BETA HCG BLOOD, ED ([facility], [facility], [facility] ONLY)  GC/CHLAMYDIA PROBE AMP ([place]) NOT AT [facility]    EKG None  Radiology Ct Abdomen Pelvis W  Contrast  Result Date: 02/08/2019 CLINICAL DATA:  33 y/o F; right lower quadrant abdominal pain, nausea, vomiting, diarrhea for 2 days. EXAM: CT ABDOMEN AND PELVIS WITH CONTRAST TECHNIQUE: Multidetector CT imaging of the abdomen and pelvis was performed using the standard protocol following bolus administration of intravenous contrast. CONTRAST:  OMNIPAQUE IOHEXOL 300 MG/ML  SOLN COMPARISON:  03/15/2016 CT abdomen and pelvis. FINDINGS: Lower chest: No acute abnormality. Hepatobiliary: Hepatic steatosis. No focal liver abnormality is seen. Status post cholecystectomy. No biliary dilatation. Pancreas: Unremarkable. No pancreatic ductal dilatation or surrounding inflammatory changes. Spleen: Normal in size without focal abnormality. Adrenals/Urinary Tract: Adrenal glands are unremarkable. Kidneys are normal, without renal calculi, focal lesion, or hydronephrosis. Bladder is unremarkable. Stomach/Bowel: Stomach is within normal limits. Appendix appears normal. No evidence of bowel wall thickening, distention, or inflammatory changes. Vascular/Lymphatic: No significant vascular findings are present. No enlarged abdominal or pelvic lymph nodes. Reproductive: Uterus and bilateral adnexa are unremarkable. Other: No abdominal wall hernia or abnormality. No abdominopelvic ascites. Musculoskeletal: No acute or significant osseous findings. IMPRESSION: 1. No acute process identified.  Normal appendix. 2. Hepatic steatosis. Electronically Signed   By: Mitzi Hansen M.D.   On: 02/08/2019 03:35    Procedures Procedures (including critical care time)  Medications Ordered in ED Medications  sodium chloride flush (NS) 0.9 % injection 3 mL (has no administration in time range)  oxyCODONE-acetaminophen (PERCOCET/ROXICET) 5-325 MG per tablet 1 tablet (1 tablet Oral Given 02/07/19 1934)  morphine 4 MG/ML injection 4 mg (4 mg Intravenous Given 02/08/19 0247)  ondansetron (ZOFRAN) injection 4 mg (4 mg  Intravenous Given 02/08/19 0247)  sodium chloride 0.9 % bolus 1,000 mL (0 mLs Intravenous Stopped 02/08/19 0426)  iohexol (OMNIPAQUE) 300 MG/ML solution 100 mL (100 mLs Intravenous Contrast Given 02/08/19 0307)  dicyclomine (BENTYL) injection 20 mg (20 mg Intramuscular Given 02/08/19 0426)  ondansetron (ZOFRAN-ODT) disintegrating tablet 4 mg (4 mg Oral Given 02/08/19 0655)  Initial Impression / Assessment and Plan / ED Course  I have reviewed the triage vital signs and the nursing notes.  Pertinent labs & imaging results that were available during my care of the patient were reviewed by me and considered in my medical decision making (see chart for details).        33 year old female with history of congenital thrombocytopenia presenting with right lower quadrant pain, nausea, and diarrhea that has worsened over the last 24 hours.  No constitutional symptoms.  On exam, she is tender to the right lower abdomen and pelvic region.  She does have a history of ovarian cyst and differential diagnosis includes appendicitis, TOA, ovarian torsion, ruptured ovarian cyst, vaginitis, PID, ectopic pregnancy secondary to failed tubal ligation, abortion, or initial presentation of Crohn's disease.  Patient was very hypertensive on arrival, which significantly improved with fluid resuscitation and pain control.  Labs notable for thrombocytopenia, which is congenital.  UA is not concerning for infection.  CT abdomen pelvis is unremarkable.  Pelvic exam with moderate vaginal discharge that is malodorous.  Wet prep positive for clue cells.  GC chlamydia pending.  We will plan to treat the patient for bacterial vaginosis with Flagyl.  The patient was successfully fluid challenged in the ER.  We discussed that gonorrhea and chlamydia test are pending, but patient declines treatment at this time.  No history of STIs.  I think this is a reasonable plan.  She will be given Bentyl for pain control and Zofran for nausea.  She  was advised to avoid alcohol while taking Flagyl.  At this time, she is hemodynamically stable and in no acute distress.  She reports that she is feeling much better.  Safe for discharge home with outpatient follow-up.  Final Clinical Impressions(s) / ED Diagnoses   Final diagnoses:  Bacterial vaginosis    ED Discharge Orders         Ordered    metroNIDAZOLE (FLAGYL) 500 MG tablet  2 times daily     02/08/19 0638    ondansetron (ZOFRAN ODT) 4 MG disintegrating tablet  Every 8 hours PRN     02/08/19 0638    dicyclomine (BENTYL) 20 MG tablet  2 times daily     02/08/19 0638           Barkley BoardsMcDonald, Braylan Faul A, PA-C 02/08/19 0948    Palumbo, April, MD 02/08/19 2310

## 2019-02-11 LAB — GC/CHLAMYDIA PROBE AMP (~~LOC~~) NOT AT ARMC
Chlamydia: NEGATIVE
Neisseria Gonorrhea: NEGATIVE

## 2019-03-23 ENCOUNTER — Emergency Department (HOSPITAL_COMMUNITY)
Admission: EM | Admit: 2019-03-23 | Discharge: 2019-03-23 | Disposition: A | Discharge status: Home / Self Care | Payer: No Typology Code available for payment source | Attending: Emergency Medicine | Admitting: Emergency Medicine

## 2019-03-23 ENCOUNTER — Emergency Department (HOSPITAL_COMMUNITY): Payer: No Typology Code available for payment source

## 2019-03-23 ENCOUNTER — Encounter (HOSPITAL_COMMUNITY): Payer: Self-pay

## 2019-03-23 ENCOUNTER — Other Ambulatory Visit: Payer: Self-pay

## 2019-03-23 DIAGNOSIS — Y999 Unspecified external cause status: Secondary | ICD-10-CM | POA: Diagnosis not present

## 2019-03-23 DIAGNOSIS — S4382XA Sprain of other specified parts of left shoulder girdle, initial encounter: Secondary | ICD-10-CM | POA: Diagnosis not present

## 2019-03-23 DIAGNOSIS — Y92410 Unspecified street and highway as the place of occurrence of the external cause: Secondary | ICD-10-CM | POA: Diagnosis not present

## 2019-03-23 DIAGNOSIS — Y93I9 Activity, other involving external motion: Secondary | ICD-10-CM | POA: Insufficient documentation

## 2019-03-23 DIAGNOSIS — S43402A Unspecified sprain of left shoulder joint, initial encounter: Secondary | ICD-10-CM

## 2019-03-23 DIAGNOSIS — S4992XA Unspecified injury of left shoulder and upper arm, initial encounter: Secondary | ICD-10-CM | POA: Diagnosis present

## 2019-03-23 MED ORDER — NAPROXEN 500 MG PO TABS: 500.0000 mg | ORAL_TABLET | Freq: Two times a day (BID) | ORAL | 0 refills | Status: DC

## 2019-03-23 MED ORDER — CYCLOBENZAPRINE HCL 10 MG PO TABS: 10.0000 mg | ORAL_TABLET | Freq: Two times a day (BID) | ORAL | 0 refills | Status: DC | PRN

## 2019-03-23 MED ORDER — NAPROXEN 500 MG PO TABS
500.0000 mg | ORAL_TABLET | Freq: Once | ORAL | Status: AC
  Administered 2019-03-23: 17:00:00 500 mg via ORAL
  Filled 2019-03-23: qty 1
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
## 2019-03-23 NOTE — ED Notes (Signed)
Pt refused discharge vital signs

## 2019-03-23 NOTE — Discharge Instructions (Signed)
Take the medications as needed for pain, follow up with an orthopedic doctor or primary care doctor if not improving in a couple of weeks, use the sling for comfort
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
## 2022-05-30 ENCOUNTER — Emergency Department (HOSPITAL_COMMUNITY): Payer: BC Managed Care – PPO

## 2022-05-30 ENCOUNTER — Observation Stay (HOSPITAL_COMMUNITY)
Admission: EM | Admit: 2022-05-30 | Discharge: 2022-05-30 | Disposition: A | Discharge status: Home / Self Care | Payer: BC Managed Care – PPO | Attending: Emergency Medicine | Admitting: Emergency Medicine

## 2022-05-30 ENCOUNTER — Encounter (HOSPITAL_COMMUNITY): Payer: Self-pay

## 2022-05-30 DIAGNOSIS — D6942 Congenital and hereditary thrombocytopenia purpura: Secondary | ICD-10-CM | POA: Diagnosis present

## 2022-05-30 DIAGNOSIS — R519 Headache, unspecified: Secondary | ICD-10-CM | POA: Diagnosis not present

## 2022-05-30 DIAGNOSIS — Z79899 Other long term (current) drug therapy: Secondary | ICD-10-CM | POA: Insufficient documentation

## 2022-05-30 DIAGNOSIS — R0789 Other chest pain: Secondary | ICD-10-CM | POA: Diagnosis present

## 2022-05-30 DIAGNOSIS — J45909 Unspecified asthma, uncomplicated: Secondary | ICD-10-CM | POA: Diagnosis not present

## 2022-05-30 DIAGNOSIS — Z6841 Body Mass Index (BMI) 40.0 and over, adult: Secondary | ICD-10-CM | POA: Insufficient documentation

## 2022-05-30 DIAGNOSIS — I5A Non-ischemic myocardial injury (non-traumatic): Secondary | ICD-10-CM | POA: Insufficient documentation

## 2022-05-30 DIAGNOSIS — I10 Essential (primary) hypertension: Secondary | ICD-10-CM | POA: Diagnosis not present

## 2022-05-30 DIAGNOSIS — D696 Thrombocytopenia, unspecified: Secondary | ICD-10-CM | POA: Insufficient documentation

## 2022-05-30 DIAGNOSIS — I160 Hypertensive urgency: Principal | ICD-10-CM | POA: Insufficient documentation

## 2022-05-30 LAB — URINALYSIS, ROUTINE W REFLEX MICROSCOPIC
Bilirubin Urine: NEGATIVE
Glucose, UA: NEGATIVE mg/dL
Hgb urine dipstick: NEGATIVE
Ketones, ur: NEGATIVE mg/dL
Leukocytes,Ua: NEGATIVE
Nitrite: NEGATIVE
Protein, ur: NEGATIVE mg/dL
Specific Gravity, Urine: 1.016 (ref 1.005–1.030)
pH: 5 (ref 5.0–8.0)

## 2022-05-30 LAB — BASIC METABOLIC PANEL
Anion gap: 8 (ref 5–15)
BUN: 8 mg/dL (ref 6–20)
CO2: 25 mmol/L (ref 22–32)
Calcium: 9.5 mg/dL (ref 8.9–10.3)
Chloride: 106 mmol/L (ref 98–111)
Creatinine, Ser: 0.89 mg/dL (ref 0.44–1.00)
GFR, Estimated: >60 mL/min (ref >60)
Glucose, Bld: 118 mg/dL — ABNORMAL HIGH (ref 70–99)
Potassium: 3.5 mmol/L (ref 3.5–5.1)
Sodium: 139 mmol/L (ref 135–145)

## 2022-05-30 LAB — CBC WITH DIFFERENTIAL/PLATELET
Abs Immature Granulocytes: 0.01 10*3/uL (ref 0.00–0.07)
Basophils Absolute: 0 10*3/uL (ref 0.0–0.1)
Basophils Relative: 1 %
Eosinophils Absolute: 0.3 10*3/uL (ref 0.0–0.5)
Eosinophils Relative: 5 %
HCT: 38.6 % (ref 36.0–46.0)
Hemoglobin: 12.7 g/dL (ref 12.0–15.0)
Immature Granulocytes: 0 %
Lymphocytes Relative: 30 %
Lymphs Abs: 2 10*3/uL (ref 0.7–4.0)
MCH: 27.7 pg (ref 26.0–34.0)
MCHC: 32.9 g/dL (ref 30.0–36.0)
MCV: 84.3 fL (ref 80.0–100.0)
Monocytes Absolute: 0.4 10*3/uL (ref 0.1–1.0)
Monocytes Relative: 6 %
Neutro Abs: 4.1 10*3/uL (ref 1.7–7.7)
Neutrophils Relative %: 58 %
Platelets: 90 10*3/uL — ABNORMAL LOW (ref 150–400)
RBC: 4.58 MIL/uL (ref 3.87–5.11)
RDW: 13.4 % (ref 11.5–15.5)
WBC: 6.9 10*3/uL (ref 4.0–10.5)
nRBC: 0 % (ref 0.0–0.2)

## 2022-05-30 LAB — PREGNANCY, URINE: Preg Test, Ur: NEGATIVE

## 2022-05-30 LAB — TROPONIN I (HIGH SENSITIVITY)
Troponin I (High Sensitivity): 18 ng/L — ABNORMAL HIGH (ref <18)
Troponin I (High Sensitivity): 18 ng/L — ABNORMAL HIGH (ref <18)

## 2022-05-30 MED ORDER — LABETALOL HCL 5 MG/ML IV SOLN
20.0000 mg | Freq: Once | INTRAVENOUS | Status: AC
  Administered 2022-05-30: 20 mg via INTRAVENOUS
  Filled 2022-05-30: qty 4

## 2022-05-30 MED ORDER — AMLODIPINE BESYLATE 5 MG PO TABS: 5.0000 mg | ORAL_TABLET | Freq: Every day | ORAL | 3 refills | Status: AC

## 2022-05-30 MED ORDER — KETOROLAC TROMETHAMINE 15 MG/ML IJ SOLN
15.0000 mg | Freq: Once | INTRAMUSCULAR | Status: AC
  Administered 2022-05-30: 15 mg via INTRAVENOUS
  Filled 2022-05-30: qty 1

## 2022-05-30 MED ORDER — AMLODIPINE BESYLATE 5 MG PO TABS
5.0000 mg | ORAL_TABLET | Freq: Every day | ORAL | Status: DC
  Administered 2022-05-30: 5 mg via ORAL
  Filled 2022-05-30: qty 1

## 2022-05-30 NOTE — Progress Notes (Signed)
Went to do pt's nursing admission history. She states she will not be staying. Darcel Smalling BSN, RN-BC Throughput Nurse 05/30/2022 6:26 PM

## 2022-05-30 NOTE — Assessment & Plan Note (Addendum)
Given labetalol and blood pressure improved to 170.  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  However the last 3 ER notes show blood pressure range 140-165 systolic within the last 3 years.  Mother has hypertension.  No evidence of end organ damage.  Goal lower blood pressure to <140/80 mmHg over next 7 days. - Start amlodipine - Follow up with new PCP, appt pending soon

## 2022-05-30 NOTE — Assessment & Plan Note (Signed)
BMI 44 

## 2022-05-30 NOTE — ED Notes (Signed)
Notified provider of pt's continued elevated BP. Per phone conversation with Dr. Maryfrances Bunnell, pt is adamant that she does not want to stay overnight in the hospital. MD has discussed risks and benefits at length and pt refuses to stay.

## 2022-05-30 NOTE — Hospital Course (Signed)
[name] [name] is a 36 y.o. 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  ECG nonischemic.  CTH normal.  Renal function normal.  Platelets 90K.  She was given IV labetalol and the hospitalist service was asked to evaluate for hypertensive urgency.

## 2022-05-30 NOTE — ED Triage Notes (Signed)
Pt c/o hypertension, headache, blurry vision, and CP since earlier today. No weakness reported, no neuro deficits noted.

## 2022-05-30 NOTE — ED Provider Triage Note (Signed)
Emergency Medicine Provider Triage Evaluation Note  [name] [name] , a 36 y.o. female  was evaluated in triage.  Pt complains of chest pain that started about Thursday last week.  Since then, she has developed headaches and blurry vision that started earlier today.  She has a history of migraines and initially thought these symptoms were related to that.  She has no prior history of hypertension and is not currently on any hypertensive medications.  She denies dizziness, shortness of breath weakness, abdominal pain, nausea, vomiting and diarrhea.  Review of Systems  Positive:  Negative:   Physical Exam  BP (!) 204/115 (BP Location: Left Arm)   Pulse 78   Temp 97.8 F (36.6 C) (Oral)   Resp 20   Ht 5\' 4"  (1.626 m)   Wt 117.9 kg   SpO2 98%   BMI 44.63 kg/m  Gen:   Awake, no distress   Resp:  Normal effort  MSK:   Moves extremities without difficulty  Other: Strong and equal grip strength bilaterally.  Eyes PERRL, EOM intact   Medical Decision Making  Medically screening exam initiated at 1:04 PM.  Appropriate orders placed.  [name] [name] was informed that the remainder of the evaluation will be completed by another provider, this initial triage assessment does not replace that evaluation, and the importance of remaining in the ED until their evaluation is complete.     Gwenyth Allegra, Janell Quiet 05/30/22 1305

## 2022-05-30 NOTE — Consult Note (Addendum)
Consult Note    Patient: [name] [name] FBP:[id number] DOB: December 23, 1985 DOA: 05/30/2022 DOS: the patient was seen and examined on 05/30/2022 PCP: Alm Bustard, MD  Patient coming from: Home  Chief Complaint:  Chief Complaint  Patient presents with   Hypertension   Chest Pain       HPI:  [name] [name] is a 36 y.o. 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  ECG nonischemic.  CTH normal.  Renal function normal.  Platelets 90K.  She was given IV labetalol and the hospitalist service was asked to evaluate for hypertensive urgency.      Review of Systems  HENT:  Positive for nosebleeds.   Eyes:  Positive for blurred vision.  Respiratory:  Negative for shortness of breath.   Cardiovascular:  Positive for chest pain. Negative for orthopnea, leg swelling and PND.  Neurological:  Positive for headaches. Negative for dizziness, sensory change, speech change, focal weakness, seizures and loss of consciousness.  All other systems reviewed and are negative.    Past Medical History:  Diagnosis Date   Allergic asthma    prn inhaler   Anemia    no current med.   Obesity    Retained orthopedic hardware 08/2016   left ankle   Thrombocytopenic [facility])    Past Surgical History:  Procedure Laterality Date   CERVICAL CERCLAGE  09/03/2010   CHOLECYSTECTOMY N/A 02/11/2016   Procedure: LAPAROSCOPIC CHOLECYSTECTOMY WITH INTRAOPERATIVE CHOLANGIOGRAM;  Surgeon: Abigail Miyamoto, MD;  Location: [facility] OR;  Service: General;  Laterality: N/A;   HARDWARE REMOVAL Left 08/31/2016   Procedure: LEFT ANKLE REMOVAL OF DEEP IMPLANTS;  Surgeon: Toni Arthurs, MD;  Location: [facility];  Service: Orthopedics;  Laterality: Left;   ORIF ANKLE FRACTURE Left 04/06/2016   Procedure: OPEN REDUCTION INTERNAL FIXATION (ORIF) LEFT BIMALLEOLAR ANKLE FRACTURE WITH POSSIBLE DELTOID LIGAMENT  REPAIR;  Surgeon: Toni Arthurs, MD;  Location: [facility] OR;  Service: Orthopedics;  Laterality: Left;   ORIF ELBOW FRACTURE Right    TUBAL LIGATION  01/18/2011   Social History:  reports that she has never smoked. She has never used smokeless tobacco. She reports current alcohol use. She reports that she does not use drugs.  Allergies  Allergen Reactions   Strawberry Extract Shortness Of Breath    Family History  Problem Relation Age of Onset   Diabetes Mother    Thyroid disease Mother    Hypertension Father    Heart attack Father   Mother and brother with same inherited impaired platelet function disorder.  Never any bleeding episodes.    Prior to Admission medications   Medication Sig Start Date End Date Taking? Authorizing Provider  EXCEDRIN MIGRAINE [phone] MG tablet Take 2 tablets by mouth every 6 (six) hours as needed for headache.   Yes [provider]  amLODipine (NORVASC) 5 MG tablet Take 1 tablet (5 mg total) by mouth daily. 05/31/22   Bao Bazen, Earl Lites, MD  cyclobenzaprine (FLEXERIL) 10 MG tablet Take 1 tablet (10 mg total) by mouth 2 (two) times daily as needed for muscle spasms. Patient not taking: Reported on 05/30/2022 03/23/19   Linwood Dibbles, MD  dicyclomine (BENTYL) 20 MG tablet Take 1 tablet (20 mg total) by mouth 2 (two) times daily. Patient not taking: Reported on 05/30/2022 02/08/19   McDonald, Mia A, PA-C  metroNIDAZOLE (FLAGYL) 500 MG tablet Take 1 tablet (500  mg total) by mouth 2 (two) times daily. Patient not taking: Reported on 05/30/2022 02/08/19   McDonald, Mia A, PA-C  naproxen (NAPROSYN) 500 MG tablet Take 1 tablet (500 mg total) by mouth 2 (two) times daily. Patient not taking: Reported on 05/30/2022 03/23/19   Linwood Dibbles, MD  ondansetron (ZOFRAN ODT) 4 MG disintegrating tablet Take 1 tablet (4 mg total) by mouth every 8 (eight) hours as needed. Patient not taking: Reported on 05/30/2022 02/08/19   Barkley Boards, PA-C    Physical Exam: Vitals:    05/30/22 1503 05/30/22 1536 05/30/22 1653 05/30/22 1730  BP: (!) 207/115 (!) 201/99 (!) 158/111 (!) 171/106  Pulse: 74 65 60 (!) 59  Resp: 18 14 14 16   Temp: 98.7 F (37.1 C)     TempSrc: Oral     SpO2: 98% 100% 98% 99%  Weight:      Height:       Well nourished adult female, lying in bed, no acute distress, talking on her iwatch. Anicteric, conjunctive a pink, lids and lashes normal.  No nasal forming, discharge, or epistaxis.  Oropharynx moist, no oral lesions. Skin is unremarkable, no suspicious rashes or lesions RRR, no murmurs, no peripheral edema noted, JVP normal Respiratory rate normal, lungs clear without rales or wheezes Abdomen soft without tenderness palpation or guarding, no ascites or distention Attention normal, affect normal, judgment and insight appear normal, oriented to person, place, and time.  5/5 strength in the upper and lower extremities bilaterally, normal sensation to light touch.  Extraocular movements intact, cranial nerves normal.       Data Reviewed: Electrocardiogram personally reviewed, shows normal sinus rhythm.  Possibly LVH. Chest x-ray personally reviewed, shows no airspace disease or opacity, possible cardiomegaly. Basic metabolic panel unremarkable, normal renal function and electrolytes Troponin minimally elevated, flat. Hemogram notable for mild thrombocytopenia.        Assessment and Plan: * Hypertensive urgency Given labetalol and blood pressure improved to 170.  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  However the last 3 ER notes show blood pressure range 140-165 systolic within the last 3 years.  Mother has hypertension.  No evidence of end organ damage.  Goal lower blood pressure to <140/80 mmHg over next 7 days. - Start amlodipine - Follow up with new PCP, appt pending soon      Obesity, Class III, BMI 40-49.9 (morbid  obesity) (HCC) BMI 44  Thrombocytopenia, primary (congenital) (HCC) I am unfamiliar with this class of diseases, but chart review and literature review suggested that it is among the class of syndromes like Bernard-Soulier and Glanzmann thrombocytopenia, i.e a very heterogeneous collection of congenital thrombocyte disorders associated with widely varying bleeding phenotypes.    In her case, she has a history of nosebleeds, more frequent in the last few months (probably associated with hypertension), but has had 4 surgeries and delivered a baby without any clinical bleeding, and also her mother and brother had same inherited thrombocytopenia and have never had clinically significant bleeding.    We discussed my recommendation to stay overnight for monitoring, blood pressure lowering in the hospital setting, and the uncertainty surrounding her risk of intracranial bleeding, but she declined to stay.     Myocardial injury Patient has high sensitivity troponin minimally elevated and flat curve.  In the absence of ECG changes or angina, this and her chest symptoms are simply related to hypertension,  not ischemia or end organ damage, not ACS.    ADDENDUM: Discussed uncertainty around risk of intracranial bleeding in setting of her hypertension and also my recommendation to have her blood pressure lowered in a monitored hospital setting.  Patient offered more than once this opportunity and declined.  She states she has follow up.     Author: Edwin Dada, MD 05/30/2022 6:18 PM  For on call review [email or web address].

## 2022-05-30 NOTE — Assessment & Plan Note (Addendum)
I am unfamiliar with this class of diseases, but chart review and literature review suggested that it is among the class of syndromes like Bernard-Soulier and Glanzmann thrombocytopenia, i.e a very heterogeneous collection of congenital thrombocyte disorders associated with widely varying bleeding phenotypes.    In her case, she has a history of nosebleeds, more frequent in the last few months (probably associated with hypertension), but has had 4 surgeries and delivered a baby without any clinical bleeding, and also her mother and brother had same inherited thrombocytopenia and have never had clinically significant bleeding.    We discussed my recommendation to stay overnight for monitoring, blood pressure lowering in the hospital setting, and the uncertainty surrounding her risk of intracranial bleeding, but she declined to stay.

## 2022-05-30 NOTE — ED Provider Notes (Signed)
[facility]-EMERGENCY DEPT Provider Note   CSN: [id number] Arrival date & time: 05/30/22  1216     History  Chief Complaint  Patient presents with   Hypertension   Chest Pain    [name] [name] is a 36 y.o. female with history of migraines who presents emergency department for evaluation of chest pain, blurred vision and headaches.  Patient states symptoms started with some chest pain on Thursday last week that has progressively worsened.  Described as pressure/tightness.  This morning, she developed blurred vision with severe headache.  She initially thought this was due to her migraine, however came to the emergency department for evaluation.  Associated symptoms to include numbness and tingling of the bilateral hands.  She denies dizziness, shortness of breath, abdominal pain, nausea, vomiting, diarrhea or weakness.   Hypertension Associated symptoms include chest pain and headaches. Pertinent negatives include no shortness of breath.  Chest Pain Associated symptoms: headache and numbness   Associated symptoms: no fever and no shortness of breath        Home Medications Prior to Admission medications   Medication Sig Start Date End Date Taking? Authorizing Provider  amLODipine (NORVASC) 5 MG tablet Take 1 tablet (5 mg total) by mouth daily. 05/31/22   Danford, Earl Lites, MD      Allergies    Strawberry extract    Review of Systems   Review of Systems  Constitutional:  Negative for fever.  Respiratory:  Negative for shortness of breath.   Cardiovascular:  Positive for chest pain.  Genitourinary:  Negative for dysuria.  Neurological:  Positive for numbness and headaches.    Physical Exam Updated Vital Signs BP (!) 181/102 (BP Location: Left Arm)   Pulse 65   Temp 98.2 F (36.8 C) (Oral)   Resp 15   Ht 5\' 4"  (1.626 m)   Wt 117.9 kg   SpO2 98%   BMI 44.63 kg/m  Physical Exam Vitals and nursing note reviewed.  Constitutional:       General: She is not in acute distress.    Appearance: She is not ill-appearing.  HENT:     Head: Atraumatic.  Eyes:     Extraocular Movements: Extraocular movements intact.     Conjunctiva/sclera: Conjunctivae normal.     Pupils: Pupils are equal, round, and reactive to light.  Cardiovascular:     Rate and Rhythm: Normal rate and regular rhythm.     Pulses: Normal pulses.          Radial pulses are 2+ on the right side and 2+ on the left side.       Dorsalis pedis pulses are 2+ on the right side and 2+ on the left side.     Heart sounds: No murmur heard. Pulmonary:     Effort: Pulmonary effort is normal. No respiratory distress.     Breath sounds: Normal breath sounds.  Abdominal:     General: Abdomen is flat. There is no distension.     Palpations: Abdomen is soft.     Tenderness: There is no abdominal tenderness.  Musculoskeletal:        General: Normal range of motion.     Cervical back: Normal range of motion.     Right lower leg: No edema.     Left lower leg: No edema.  Skin:    General: Skin is warm and dry.     Capillary Refill: Capillary refill takes less than 2 seconds.  Neurological:  General: No focal deficit present.     Mental Status: She is alert.     Comments: Speech is clear, able to follow commands CN III-XII intact Normal strength in upper and lower extremities bilaterally including dorsiflexion and plantar flexion, strong and equal grip strength Sensation normal to light and sharp touch Moves extremities without ataxia, coordination intact Normal finger to nose and rapid alternating movements No pronator drift    Psychiatric:        Mood and Affect: Mood normal.     ED Results / Procedures / Treatments   Labs (all labs ordered are listed, but only abnormal results are displayed) Labs Reviewed  BASIC METABOLIC PANEL - Abnormal; Notable for the following components:      Result Value   Glucose, Bld 118 (*)    All other components within normal  limits  CBC WITH DIFFERENTIAL/PLATELET - Abnormal; Notable for the following components:   Platelets 90 (*)    All other components within normal limits  URINALYSIS, ROUTINE W REFLEX MICROSCOPIC - Abnormal; Notable for the following components:   APPearance HAZY (*)    All other components within normal limits  TROPONIN I (HIGH SENSITIVITY) - Abnormal; Notable for the following components:   Troponin I (High Sensitivity) 18 (*)    All other components within normal limits  TROPONIN I (HIGH SENSITIVITY) - Abnormal; Notable for the following components:   Troponin I (High Sensitivity) 18 (*)    All other components within normal limits  PREGNANCY, URINE    EKG EKG Interpretation  Date/Time:  Tuesday May 30 2022 13:01:53 EDT Ventricular Rate:  77 PR Interval:  159 QRS Duration: 98 QT Interval:  419 QTC Calculation: 475 R Axis:   -8 Text Interpretation: Sinus rhythm Left ventricular hypertrophy Nonspecific T abnormalities, lateral leads Confirmed by Gloris Manchester [phone]) on 05/30/2022 5:34:43 PM  Radiology DG Chest 2 View  Result Date: 05/30/2022 CLINICAL DATA:  Hypertension, headache, blurred vision and chest pain. EXAM: CHEST - 2 VIEW COMPARISON:  None Available. FINDINGS: The heart size and mediastinal contours are within normal limits. Lung volumes are relatively low bilaterally with bibasilar atelectasis present. There is no evidence of pulmonary edema, consolidation, pneumothorax, nodule or pleural fluid. The visualized skeletal structures are unremarkable. IMPRESSION: Low bilateral lung volumes with bibasilar atelectasis. Electronically Signed   By: Irish Lack M.D.   On: 05/30/2022 13:29   CT Head Wo Contrast  Result Date: 05/30/2022 CLINICAL DATA:  Sudden severe headache.  Blurred vision. EXAM: CT HEAD WITHOUT CONTRAST TECHNIQUE: Contiguous axial images were obtained from the base of the skull through the vertex without intravenous contrast. RADIATION DOSE REDUCTION: This  exam was performed according to the departmental dose-optimization program which includes automated exposure control, adjustment of the mA and/or kV according to patient size and/or use of iterative reconstruction technique. COMPARISON:  None Available. FINDINGS: Brain: The brain shows a normal appearance without evidence of malformation, atrophy, old or acute small or large vessel infarction, mass lesion, hemorrhage, hydrocephalus or extra-axial collection. Vascular: No hyperdense vessel. No evidence of atherosclerotic calcification. Skull: Normal.  No traumatic finding.  No focal bone lesion. Sinuses/Orbits: Sinuses are clear. Orbits appear normal. Mastoids are clear. Other: None significant IMPRESSION: Normal head CT Electronically Signed   By: Paulina Fusi M.D.   On: 05/30/2022 13:23    Procedures Procedures    Medications Ordered in ED Medications  amLODipine (NORVASC) tablet 5 mg (5 mg Oral Given 05/30/22 1816)  labetalol (NORMODYNE) injection 20  mg (20 mg Intravenous Given 05/30/22 1616)  ketorolac (TORADOL) 15 MG/ML injection 15 mg (15 mg Intravenous Given 05/30/22 1616)    ED Course/ Medical Decision Making/ A&P                           Medical Decision Making Amount and/or Complexity of Data Reviewed Labs: ordered. Radiology: ordered.  Risk Prescription drug management. Decision regarding hospitalization.   Social determinants of health:  Social History   Socioeconomic History   Marital status: Single    Spouse name: Not on file   Number of children: Not on file   Years of education: Not on file   Highest education level: Not on file  Occupational History   Not on file  Tobacco Use   Smoking status: Never   Smokeless tobacco: Never  Substance and Sexual Activity   Alcohol use: Yes    Alcohol/week: 0.0 standard drinks of alcohol    Comment: socially   Drug use: No   Sexual activity: Not on file  Other Topics Concern   Not on file  Social History Narrative   Not  on file   Social Determinants of Health   Financial Resource Strain: Not on file  Food Insecurity: Not on file  Transportation Needs: Not on file  Physical Activity: Not on file  Stress: Not on file  Social Connections: Not on file  Intimate Partner Violence: Not on file     Initial impression:  This patient presents to the ED for concern of chest pain, headache, blurred vision, this involves an extensive number of treatment options, and is a complaint that carries with it a high risk of complications and morbidity.   Differentials include stroke, ACS, hypertensive urgency, hypertensive emergency, migraine, complex migraine.   Comorbidities affecting care:  Migraines   Additional history obtained: none  Lab Tests  I Ordered, reviewed, and interpreted labs and EKG.  The pertinent results include:  BMP: normal CBC: no leukocytosis Initial trop: 18 UA: normal  Imaging Studies ordered:  I ordered imaging studies including  CT head without acute findings Chest xray with low lung volumes, possible atelectasis  I independently visualized and interpreted imaging and I agree with the radiologist interpretation.   EKG: Sinus rhythm - LVH   Medicines ordered and prescription drug management:  I ordered medication including: Toradol 15 mg Labetalol 20 mg   Reevaluation of the patient after these medicines showed that the patient  initially improved and then began to worsen I have reviewed the patients home medicines and have made adjustments as needed   ED Course/Re-evaluation: Patient is ill-appearing although nontoxic.  Vitals are concerning a blood pressure of 204/115.  Neuro exam normal.  Concern for hypertensive crisis.  Labs ordered in triage overall reassuring.  Kidney function normal, no leukocytosis.  Delta troponin of 18.  She was given initial dose of labetalol 20 mg with some improvement with blood pressure decreasing down to 158/111 before increasing once again.   Chest x-ray and head CT normal.  Discussed the case with my attending, Dr. Durwin Nora who agrees with admission for hypertensive crisis.  Patient is reluctant for admission, however given her symptomatic hypertension that is currently uncontrolled, I strongly encouraged her to stay.  We discussed risks of leaving.  She agrees to stay. Dr. Maryfrances Bunnell agrees to accept patient and came down to personally evaluate.  During his evaluation, she again expressed desire for discharge.  Per my conversation  with Dr. Maryfrances Bunnell, he also encouraged her to stay for overnight observation and treatment. However, patient still wishes for discharge and upon his chart review, patient has had elevated blood pressures at previous ED visits and suspects that she has been chronically elevated for some time. He plans to give amlodipine po then reassess in one hour with anticipate discharge home from hospital service. See his note for further details.  Disposition:  After consideration of the diagnostic results, physical exam, history and the patients response to treatment feel that the patent would benefit from admission.   Hypertension: Plan and management as described above. Discharged home in good condition.  Final Clinical Impression(s) / ED Diagnoses Final diagnoses:  Hypertension, unspecified type    Rx / DC Orders ED Discharge Orders          Ordered    amLODipine (NORVASC) 5 MG tablet  Daily        05/30/22 1818    Increase activity slowly        05/30/22 1936    Discharge instructions       Comments: From Dr. Maryfrances Bunnell: You were admitted for high blood pressure. You had no evidence of kidney failure, stroke, heart failure or heart attack.    You should start the blood pressure medicine amlodipine  Take amlodipine 5 mg daily Check your blood pressure at home once daily If your blood pressure is not less than 160 by 3 days from now, double the dose of amlodipine to 10 mg daily and call your new primary care office  for closer follow up   As we discussed, there is uncertainty about the safety of severe high blood pressure given your platelet problem, and I recommend that you stay for lowering your blood pressure in a monitored setting, but understand.   05/30/22 1936              Janell Quiet, PA-C 05/30/22 2041    Gloris Manchester, MD 05/31/22 [phone]

## 2023-10-11 ENCOUNTER — Telehealth: Payer: Self-pay | Admitting: Internal Medicine

## 2023-10-11 NOTE — Telephone Encounter (Signed)
Patient is aware of scheduled appointments times/dates

## 2023-11-05 ENCOUNTER — Inpatient Hospital Stay: Payer: No Typology Code available for payment source | Admitting: Internal Medicine

## 2023-11-05 ENCOUNTER — Telehealth: Payer: Self-pay | Admitting: Medical Oncology

## 2023-11-05 ENCOUNTER — Inpatient Hospital Stay: Payer: No Typology Code available for payment source

## 2023-11-05 ENCOUNTER — Other Ambulatory Visit: Payer: Self-pay | Admitting: Internal Medicine

## 2023-11-05 DIAGNOSIS — D6942 Congenital and hereditary thrombocytopenia purpura: Secondary | ICD-10-CM

## 2023-11-05 NOTE — Telephone Encounter (Signed)
She forgot about appt . 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

## 2023-11-09 ENCOUNTER — Other Ambulatory Visit: Payer: Self-pay | Admitting: Internal Medicine

## 2023-11-09 NOTE — Progress Notes (Signed)
error 

## 2023-11-10 ENCOUNTER — Inpatient Hospital Stay: Payer: 59 | Attending: Internal Medicine | Admitting: Internal Medicine

## 2023-11-10 ENCOUNTER — Inpatient Hospital Stay: Payer: 59

## 2023-11-10 VITALS — BP 169/92 | HR 62 | Temp 98.1°F | Resp 16 | Wt 284.3 lb

## 2023-11-10 DIAGNOSIS — D693 Immune thrombocytopenic purpura: Secondary | ICD-10-CM | POA: Insufficient documentation

## 2023-11-10 DIAGNOSIS — Z8349 Family history of other endocrine, nutritional and metabolic diseases: Secondary | ICD-10-CM | POA: Insufficient documentation

## 2023-11-10 DIAGNOSIS — R040 Epistaxis: Secondary | ICD-10-CM | POA: Diagnosis not present

## 2023-11-10 DIAGNOSIS — D649 Anemia, unspecified: Secondary | ICD-10-CM | POA: Diagnosis not present

## 2023-11-10 DIAGNOSIS — D696 Thrombocytopenia, unspecified: Secondary | ICD-10-CM | POA: Diagnosis not present

## 2023-11-10 DIAGNOSIS — D6942 Congenital and hereditary thrombocytopenia purpura: Secondary | ICD-10-CM | POA: Insufficient documentation

## 2023-11-10 LAB — CBC WITH DIFFERENTIAL (CANCER CENTER ONLY)
Abs Immature Granulocytes: 0.02 10*3/uL (ref 0.00–0.07)
Basophils Absolute: 0 10*3/uL (ref 0.0–0.1)
Basophils Relative: 1 %
Eosinophils Absolute: 0.3 10*3/uL (ref 0.0–0.5)
Eosinophils Relative: 4 %
HCT: 37.3 % (ref 36.0–46.0)
Hemoglobin: 12.5 g/dL (ref 12.0–15.0)
Immature Granulocytes: 0 %
Lymphocytes Relative: 34 %
Lymphs Abs: 2.4 10*3/uL (ref 0.7–4.0)
MCH: 28 pg (ref 26.0–34.0)
MCHC: 33.5 g/dL (ref 30.0–36.0)
MCV: 83.4 fL (ref 80.0–100.0)
Monocytes Absolute: 0.4 10*3/uL (ref 0.1–1.0)
Monocytes Relative: 6 %
Neutro Abs: 4 10*3/uL (ref 1.7–7.7)
Neutrophils Relative %: 55 %
Platelet Count: 87 10*3/uL — ABNORMAL LOW (ref 150–400)
RBC: 4.47 MIL/uL (ref 3.87–5.11)
RDW: 13.5 % (ref 11.5–15.5)
WBC Count: 7.1 10*3/uL (ref 4.0–10.5)
nRBC: 0 % (ref 0.0–0.2)

## 2023-11-10 LAB — CMP (CANCER CENTER ONLY)
ALT: 13 U/L (ref 0–44)
AST: 15 U/L (ref 15–41)
Albumin: 4.2 g/dL (ref 3.5–5.0)
Alkaline Phosphatase: 59 U/L (ref 38–126)
Anion gap: 6 (ref 5–15)
BUN: 11 mg/dL (ref 6–20)
CO2: 27 mmol/L (ref 22–32)
Calcium: 8.8 mg/dL — ABNORMAL LOW (ref 8.9–10.3)
Chloride: 103 mmol/L (ref 98–111)
Creatinine: 0.86 mg/dL (ref 0.44–1.00)
GFR, Estimated: >60 mL/min (ref >60)
Glucose, Bld: 125 mg/dL — ABNORMAL HIGH (ref 70–99)
Potassium: 3.6 mmol/L (ref 3.5–5.1)
Sodium: 136 mmol/L (ref 135–145)
Total Bilirubin: 0.5 mg/dL (ref 0.0–1.2)
Total Protein: 7.4 g/dL (ref 6.5–8.1)

## 2023-11-10 LAB — LACTATE DEHYDROGENASE: LDH: 178 U/L (ref 98–192)

## 2023-11-10 LAB — HEPATITIS PANEL, ACUTE
HCV Ab: NONREACTIVE
Hep A IgM: NONREACTIVE
Hep B C IgM: NONREACTIVE
Hepatitis B Surface Ag: NONREACTIVE

## 2023-11-10 LAB — FERRITIN: Ferritin: 32 ng/mL (ref 11–307)

## 2023-11-10 LAB — IRON AND IRON BINDING CAPACITY (CC-WL,HP ONLY)
Iron: 84 ug/dL (ref 28–170)
Saturation Ratios: 20 % (ref 10.4–31.8)
TIBC: 417 ug/dL (ref 250–450)
UIBC: 333 ug/dL (ref 148–442)

## 2023-11-10 LAB — HIV ANTIBODY (ROUTINE TESTING W REFLEX): HIV Screen 4th Generation wRfx: NONREACTIVE

## 2023-11-10 LAB — FOLATE: Folate: 5.7 ng/mL — ABNORMAL LOW (ref >5.9)

## 2023-11-10 LAB — VITAMIN B12: Vitamin B-12: 296 pg/mL (ref 180–914)

## 2023-11-10 LAB — TSH: TSH: 3.345 u[IU]/mL (ref 0.350–4.500)

## 2023-11-10 NOTE — Progress Notes (Signed)
 [facility] [phone]   [phone]  CONSULT NOTE  REFERRING PHYSICIAN: Dr. Arther Abbott  REASON FOR CONSULTATION:  38 years old African-American female with thrombocytopenia  HPI [name] [name] is a 38 y.o. female. Discussed the use of AI scribe software for clinical note transcription with the patient, who gave verbal consent to proceed.  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 She was referred by her family doctor for evaluation of persistent low platelet count.  In 2017, she was diagnosed with idiopathic thrombocytopenic purpura (ITP) after a platelet count of 73,000 was identified. At that time, her hemoglobin was 11.1, and hematocrit was 35.4. She was managed with observation. Recent blood work on May 30, 2022, showed a platelet count of 90,000, prompting referral for further evaluation. Currently, her platelet count is 87,000, with a hemoglobin level of 12.5 and a normal white blood cell count of 7.1.  She experiences epistaxis, particularly when blowing her nose, which can sometimes be profuse and last for an hour or two. She has not consulted an ear, nose, and throat specialist for this issue.  She experiences extreme fatigue throughout the day, which she attributes to anemia. She is not currently taking any supplements. She underwent uterine ablation due to excessive menstrual bleeding and no longer has menstrual cycles.  She was diagnosed with Lyme disease approximately two months ago and completed a course of antibiotics. She also experiences headaches.  Her past medical history includes asthma, anemia, and a cholecystectomy. No history of diabetes, heart disease, stroke, or high cholesterol.  Family history is significant for her mother having ITP, Graves' disease, diabetes, and a stroke. Her father had a heart attack. Several  brothers have ITP, and her sister has anemia.  She works in Chemical engineer and coding, is not married, has one child, does not smoke, drinks alcohol socially, and does not use drugs.       HPI  Past Medical History:  Diagnosis Date   Allergic asthma    prn inhaler   Anemia    no current med.   Obesity    Retained orthopedic hardware 08/2016   left ankle   Thrombocytopenic [facility])     Past Surgical History:  Procedure Laterality Date   CERVICAL CERCLAGE  09/03/2010   CHOLECYSTECTOMY N/A 02/11/2016   Procedure: LAPAROSCOPIC CHOLECYSTECTOMY WITH INTRAOPERATIVE CHOLANGIOGRAM;  Surgeon: Abigail Miyamoto, MD;  Location: [facility] OR;  Service: General;  Laterality: N/A;   HARDWARE REMOVAL Left 08/31/2016   Procedure: LEFT ANKLE REMOVAL OF DEEP IMPLANTS;  Surgeon: Toni Arthurs, MD;  Location: [facility];  Service: Orthopedics;  Laterality: Left;   ORIF ANKLE FRACTURE Left 04/06/2016   Procedure: OPEN REDUCTION INTERNAL FIXATION (ORIF) LEFT BIMALLEOLAR ANKLE FRACTURE WITH POSSIBLE DELTOID LIGAMENT REPAIR;  Surgeon: Toni Arthurs, MD;  Location: [facility] OR;  Service: Orthopedics;  Laterality: Left;   ORIF ELBOW FRACTURE Right    TUBAL LIGATION  01/18/2011    Family History  Problem Relation Age of Onset   Diabetes Mother    Thyroid disease Mother    Hypertension Father    Heart attack Father     Social History Social History   Tobacco Use   Smoking status: Never   Smokeless tobacco: Never  Substance Use Topics   Alcohol use: Yes    Alcohol/week: 0.0 standard drinks of alcohol    Comment: socially  Drug use: No    Allergies  Allergen Reactions   Strawberry Extract Shortness Of Breath    Current Outpatient Medications  Medication Sig Dispense Refill   amLODipine (NORVASC) 5 MG tablet Take 1 tablet (5 mg total) by mouth daily. 30 tablet 3   No current facility-administered medications for this visit.    Review of Systems  Constitutional: positive for fatigue Eyes:  negative Ears, nose, mouth, throat, and face: negative Respiratory: negative Cardiovascular: negative Gastrointestinal: negative Genitourinary:negative Integument/breast: negative Hematologic/lymphatic: negative Musculoskeletal:negative Neurological: negative Behavioral/Psych: negative Endocrine: negative Allergic/Immunologic: negative  Physical Exam  ZOX:WRUEA, healthy, no distress, well nourished, well developed, and obese SKIN: skin color, texture, turgor are normal HEAD: Normocephalic EYES: normal, PERRLA, Conjunctiva are pink and non-injected EARS: External ears normal, Canals clear OROPHARYNX:no exudate, no erythema, and lips, buccal mucosa, and tongue normal  NECK: supple, no adenopathy, no JVD LYMPH:  no palpable lymphadenopathy, no hepatosplenomegaly BREAST:not examined LUNGS: clear to auscultation , and palpation HEART: regular rate & rhythm, no murmurs, and no gallops ABDOMEN:abdomen soft, non-tender, obese, normal bowel sounds, and no masses or organomegaly BACK: Back symmetric, no curvature., No CVA tenderness EXTREMITIES:no joint deformities, effusion, or inflammation, no edema  NEURO: alert & oriented x 3 with fluent speech, no focal motor/sensory deficits  PERFORMANCE STATUS: ECOG 1  LABORATORY DATA: Lab Results  Component Value Date   WBC 6.9 05/30/2022   HGB 12.7 05/30/2022   HCT 38.6 05/30/2022   MCV 84.3 05/30/2022   PLT 90 (L) 05/30/2022      Chemistry      Component Value Date/Time   NA 139 05/30/2022 1327   NA 140 09/14/2014 1407   K 3.5 05/30/2022 1327   K 3.5 09/14/2014 1407   CL 106 05/30/2022 1327   CO2 25 05/30/2022 1327   CO2 23 09/14/2014 1407   BUN 8 05/30/2022 1327   BUN 9.0 09/14/2014 1407   CREATININE 0.89 05/30/2022 1327   CREATININE 0.8 09/14/2014 1407      Component Value Date/Time   CALCIUM 9.5 05/30/2022 1327   CALCIUM 8.6 09/14/2014 1407   ALKPHOS 58 02/07/2019 1928   ALKPHOS 54 09/14/2014 1407   AST 28 02/07/2019  1928   AST 17 09/14/2014 1407   ALT 22 02/07/2019 1928   ALT 20 09/14/2014 1407   BILITOT 0.5 02/07/2019 1928   BILITOT 0.25 09/14/2014 1407       RADIOGRAPHIC STUDIES: No results found.  ASSESSMENT AND PLAN:    Congenital Thrombocytopenia Chronic low platelet count at 87,000. 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 No significant bleeding issues except occasional epistaxis. Intervention not needed unless platelets drop below 30,000 or significant bleeding occurs. Potential need for steroids or other medications if platelets drop significantly or if major surgery is planned. Current platelet count is well-managed and better than previous counts, thus no immediate intervention required. - Order additional blood work for thyroid, hepatitis, and HIV - Monitor platelet count - Follow up in one year unless symptoms worsen  Anemia Current hemoglobin at 12.5, previously 11.1. Reports extreme fatigue likely related to anemia. No current supplementation. Anemia likely contributing to fatigue; monitoring hemoglobin levels is essential. - Monitor hemoglobin levels - Consider iron supplementation if anemia worsens  Epistaxis Intermittent, sometimes profuse, lasting up to an hour or two. No previous ENT consultation. ENT referral may be necessary if epistaxis persists or worsens. - Consider referral to ENT if epistaxis persists or worsens  Lyme Disease Diagnosed two months ago  and treated with antibiotics. No current symptoms. Monitoring for recurrence of symptoms discussed. - Monitor for recurrence of symptoms  General Health Maintenance Elevated fasting blood sugar at 125. Family history of diabetes, heart disease, and thyroid issues. Importance of monitoring blood sugar levels and lifestyle modifications to prevent diabetes discussed. - Monitor blood sugar levels - Discuss lifestyle modifications with family doctor to prevent diabetes  Follow-up -  Schedule follow-up visit in one year.   The patient was advised to call immediately if she has any concerning symptoms in the interval.   The patient voices understanding of current disease status and treatment options and is in agreement with the current care plan.  All questions were answered. The patient knows to call the clinic with any problems, questions or concerns. We can certainly see the patient much sooner if necessary.  Thank you so much for allowing me to participate in the care of [facility]. I will continue to follow up the patient with you and assist in her care.  The total time spent in the appointment was 60 minutes.  Disclaimer: This note was dictated with voice recognition software. Similar sounding words can inadvertently be transcribed and may not be corrected upon review.   Lajuana Matte November 10, 2023, 8:29 AM

## 2023-11-12 LAB — RHEUMATOID FACTOR: Rheumatoid fact SerPl-aCnc: 10.4 [IU]/mL (ref <14.0)

## 2023-11-16 LAB — ANTINUCLEAR ANTIBODIES, IFA: ANA Ab, IFA: NEGATIVE
